# Patient Record
Sex: Female | Born: 1955 | Race: White | Hispanic: No | Marital: Single | Smoking: Current every day smoker
Health system: Southern US, Community
[De-identification: ages and names within clinical notes are randomized; demographics above are authoritative.]

## PROBLEM LIST (undated history)

## (undated) DIAGNOSIS — M5430 Sciatica, unspecified side: Secondary | ICD-10-CM

## (undated) DIAGNOSIS — J309 Allergic rhinitis, unspecified: Secondary | ICD-10-CM

## (undated) DIAGNOSIS — N200 Calculus of kidney: Secondary | ICD-10-CM

## (undated) HISTORY — DX: Allergic rhinitis, unspecified: J30.9

## (undated) HISTORY — DX: Calculus of kidney: N20.0

## (undated) HISTORY — PX: NO PAST SURGERIES: SHX2092

## (undated) HISTORY — PX: EYE SURGERY: SHX253

---

## 2001-03-13 ENCOUNTER — Encounter: Payer: Self-pay | Admitting: Emergency Medicine

## 2001-03-13 ENCOUNTER — Emergency Department (HOSPITAL_COMMUNITY): Admission: EM | Admit: 2001-03-13 | Discharge: 2001-03-13 | Payer: Self-pay | Admitting: Emergency Medicine

## 2001-04-14 ENCOUNTER — Other Ambulatory Visit: Admission: RE | Admit: 2001-04-14 | Discharge: 2001-04-14 | Payer: Self-pay | Admitting: *Deleted

## 2009-04-29 ENCOUNTER — Emergency Department: Payer: Self-pay | Admitting: Emergency Medicine

## 2012-02-23 ENCOUNTER — Emergency Department (HOSPITAL_COMMUNITY): Payer: Self-pay

## 2012-02-23 ENCOUNTER — Emergency Department (HOSPITAL_COMMUNITY)
Admission: EM | Admit: 2012-02-23 | Discharge: 2012-02-23 | Disposition: A | Discharge status: Home / Self Care | Payer: Self-pay | Attending: Emergency Medicine | Admitting: Emergency Medicine

## 2012-02-23 ENCOUNTER — Encounter (HOSPITAL_COMMUNITY): Payer: Self-pay | Admitting: Emergency Medicine

## 2012-02-23 DIAGNOSIS — R109 Unspecified abdominal pain: Secondary | ICD-10-CM | POA: Insufficient documentation

## 2012-02-23 DIAGNOSIS — F17200 Nicotine dependence, unspecified, uncomplicated: Secondary | ICD-10-CM | POA: Insufficient documentation

## 2012-02-23 DIAGNOSIS — R911 Solitary pulmonary nodule: Secondary | ICD-10-CM | POA: Insufficient documentation

## 2012-02-23 DIAGNOSIS — N390 Urinary tract infection, site not specified: Secondary | ICD-10-CM | POA: Insufficient documentation

## 2012-02-23 DIAGNOSIS — R319 Hematuria, unspecified: Secondary | ICD-10-CM | POA: Insufficient documentation

## 2012-02-23 DIAGNOSIS — R071 Chest pain on breathing: Secondary | ICD-10-CM | POA: Insufficient documentation

## 2012-02-23 DIAGNOSIS — M549 Dorsalgia, unspecified: Secondary | ICD-10-CM | POA: Insufficient documentation

## 2012-02-23 HISTORY — DX: Sciatica, unspecified side: M54.30

## 2012-02-23 LAB — URINE MICROSCOPIC-ADD ON

## 2012-02-23 LAB — URINALYSIS, ROUTINE W REFLEX MICROSCOPIC
Bilirubin Urine: NEGATIVE
Glucose, UA: NEGATIVE mg/dL
Ketones, ur: NEGATIVE mg/dL
Nitrite: POSITIVE — AB
Protein, ur: NEGATIVE mg/dL
Specific Gravity, Urine: 1.02 (ref 1.005–1.030)
Urobilinogen, UA: 0.2 mg/dL (ref 0.0–1.0)
pH: 7 (ref 5.0–8.0)

## 2012-02-23 MED ORDER — ONDANSETRON HCL 4 MG/2ML IJ SOLN
4.0000 mg | Freq: Once | INTRAMUSCULAR | Status: AC
  Administered 2012-02-23: 4 mg via INTRAVENOUS
  Filled 2012-02-23: qty 2

## 2012-02-23 MED ORDER — OXYCODONE-ACETAMINOPHEN 5-325 MG PO TABS: 1.0000 | ORAL_TABLET | ORAL | Status: AC | PRN

## 2012-02-23 MED ORDER — CIPROFLOXACIN HCL 250 MG PO TABS: 250.0000 mg | ORAL_TABLET | Freq: Two times a day (BID) | ORAL | Status: AC

## 2012-02-23 MED ORDER — MORPHINE SULFATE 4 MG/ML IJ SOLN
4.0000 mg | Freq: Once | INTRAMUSCULAR | Status: AC
  Administered 2012-02-23: 4 mg via INTRAVENOUS
  Filled 2012-02-23: qty 1

## 2012-02-23 NOTE — ED Notes (Signed)
Pt c/o mid abd radiating to left side for 2 weeks. Pt states it feels like she pulled a muscle so she had been applying heat.  Pt states today, she felt a "pop" in her left side and the pain became severe.

## 2012-02-23 NOTE — ED Notes (Signed)
Pt to CT via stretcher

## 2012-02-23 NOTE — ED Provider Notes (Signed)
History     CSN: [id number]  Arrival date & time 02/23/12  1347   First MD Initiated Contact with Patient 02/23/12 1359      Chief Complaint  Patient presents with  . Abdominal Pain    (Consider location/radiation/quality/duration/timing/severity/associated sxs/prior treatment) HPI Hx from patient. 56 year old female who presents with left side pain. She states this has been ongoing for about the past 2 weeks. She presents today, as she states she rolled over in bed and heard a "pop"; her pain suddenly worsened with this. Pain is sharp and somewhat pleuritic in nature, and worsens with movements. It is located primarily to the L lower ribs but radiates down her side. Waxes and wanes in intensity. She denies any shortness of breath, nausea, vomiting, diaphoresis. Denies urinary sx. She's never had anything like this before. No known injury. No treatment at home prior to arrival.  Past Medical History  Diagnosis Date  . Sciatica     History reviewed. No pertinent past surgical history.  History reviewed. No pertinent family history.  History  Substance Use Topics  . Smoking status: Current Everyday Smoker  . Smokeless tobacco: Not on file  . Alcohol Use: No    OB History    Grav Para Term Preterm Abortions TAB SAB Ect Mult Living                  Review of Systems  Constitutional: Negative for fever, chills, activity change and appetite change.  HENT: Negative.   Respiratory: Negative for chest tightness and shortness of breath.   Cardiovascular: Positive for chest pain. Negative for palpitations.  Gastrointestinal: Negative for nausea, vomiting and abdominal pain.  Genitourinary: Negative for decreased urine volume.  Musculoskeletal: Positive for myalgias.  Skin: Negative for color change and rash.  Neurological: Negative for dizziness, weakness and numbness.    Allergies  Sulfa antibiotics  Home Medications  No current outpatient prescriptions on file.  BP  167/99  Pulse 79  Temp(Src) 99 F (37.2 C) (Oral)  Resp 27  SpO2 100%  Physical Exam  Nursing note and vitals reviewed. Constitutional: She appears well-developed and well-nourished. No distress.  HENT:  Head: Normocephalic and atraumatic.  Eyes: Pupils are equal, round, and reactive to light.  Neck: Normal range of motion.  Cardiovascular: Normal rate, regular rhythm and normal heart sounds.   Pulmonary/Chest: Effort normal and breath sounds normal. She exhibits tenderness.       TTP to L lower ribs, no palpable crepitus or deformity Breath sounds normal throughout bilateral lung fields  Abdominal: Soft. Bowel sounds are normal. There is no tenderness. There is no rebound and no guarding.  Musculoskeletal: Normal range of motion.  Neurological: She is alert.  Skin: Skin is warm and dry. She is not diaphoretic.  Psychiatric: She has a normal mood and affect.    ED Course  Procedures (including critical care time)  Labs Reviewed  URINALYSIS, ROUTINE W REFLEX MICROSCOPIC - Abnormal; Notable for the following:    APPearance CLOUDY (*)    Hgb urine dipstick LARGE (*)    Nitrite POSITIVE (*)    Leukocytes, UA SMALL (*)    All other components within normal limits  URINE MICROSCOPIC-ADD ON - Abnormal; Notable for the following:    Bacteria, UA MANY (*)    All other components within normal limits   Ct Abdomen Pelvis Wo Contrast  02/23/2012  *RADIOLOGY REPORT*  Clinical Data: Left flank pain.  Hematuria.  CT ABDOMEN AND PELVIS WITHOUT  CONTRAST  Technique:  Multidetector CT imaging of the abdomen and pelvis was performed following the standard protocol without intravenous contrast.  Comparison: Rib radiographs same date.  No other comparison studies.  Findings: As suggested on the preceding radiographs, there is a lobulated noncalcified pulmonary nodule anteriorly at the left lung base, probably anterior to the fissure on the reformatted images. This measures 1.3 cm in diameter.  The  lung bases are otherwise clear.  There is a 3 mm calculus in the lower pole of the left kidney.  No other renal calculi are demonstrated.  There is mild left-sided hydronephrosis and perinephric soft tissue stranding.  However, there is no evidence of left ureteral or bladder calculus.  The right kidney appears normal.  The liver demonstrates diffuse steatosis with probable sparing centrally.  As evaluated in the noncontrast state, the gallbladder, biliary system, pancreas, spleen and adrenal glands appear normal.  The bowel gas pattern is normal.  Although the appendix is slightly prominent, it is filled with air and shows no surrounding inflammatory change.  The uterus and ovaries appear normal.  There are bilateral pelvic phleboliths.  There is moderate diffuse aorto iliac atherosclerosis.  There are bilateral hip degenerative changes.  IMPRESSION:  1.  Nonobstructing 3 mm left renal calculus. 2.  Left-sided hydronephrosis and perinephric soft tissue stranding of undetermined etiology.  There is no evidence of ureteral or bladder calculus.  Findings could be secondary to a recently passed ureteral calculus or blood clot.  Correlation with urinalysis recommended. 3.  Left basilar pulmonary nodule, likely in the lingula.  This is concerning for bronchogenic carcinoma.  Full chest CT again recommended.  Original Report Authenticated By: Gerrianne Scale, M.D.   Dg Ribs Unilateral W/chest Left  02/23/2012  *RADIOLOGY REPORT*  Clinical Data: Left posterior rib and back pain.  LEFT RIBS AND CHEST - 3+ VIEW  Comparison: None.  Findings: The heart size and mediastinal contours are normal.  No acute left-sided rib fractures are identified.  There are probable old fractures of the left anterior sixth and seventh ribs.  There is concern of a 1.4 cm nodule at the left lung base, overlapping the cardiac apex.  This does not appear to be a rib lesion based on the oblique views.  A nodular density projecting over the  anterior aspect of the left first rib is probably osseous but is only visualized in one projection.  Contralateral density is more clearly osseous.  There is no confluent airspace opacity, pleural effusion or pneumothorax.  IMPRESSION:  1.  No evidence of acute left-sided rib fracture.  There are probable old left-sided rib fractures. 2.  Possible left basilar pulmonary nodule.  This could reflect a skin lesion (not a nipple shadow) or a granuloma.  However, chest CT is recommended since there are no prior studies for comparison. The apical density (likely osseous) could be addressed at that time.  Original Report Authenticated By: Gerrianne Scale, M.D.     No diagnosis found. 1) hematuria 2) UTI 3) pulmonary nodule 4) L flank pain    MDM  This 56 year old female presents with left-sided pain. This has been present for about the past 2 weeks, but suddenly worsened today. She additionally began to notice pain with urination while in the dept. Vital signs are stable. She does have reproducible pain over the left lower ribs laterally and posteriorly. No notable CVA tenderness. She was medicated with one dose of morphine and Zofran, and did well with this. UA  does show evidence for possible UTI, with heme-positive urine. We did proceed with a CT of the abdomen and pelvis, which showed some left perinephric soft tissue stranding, which may indicate recently passed stone. The CT also showed evidence of a pulmonary nodule, which is concerning for bronchogenic carcinoma, especially in light of patient's smoking history. I discussed these findings with the patient, and strongly emphasized the importance of followup for an outpatient chest CT. She verbalized understanding of this. Will treat with abx for possible UTI. Return precautions discussed.  Case d/w Dr. Lynelle Doctor.       Grant Fontana, [place] 02/23/12 1754  Grant Fontana, [place] 02/23/12 1755

## 2012-02-23 NOTE — ED Notes (Signed)
Pt reports left side pain for 2 weeks, today felt something "Pop" and is having severe pain

## 2012-02-23 NOTE — Discharge Instructions (Signed)
As we discussed, your CT of your abdomen did show a possible pulmonary nodule. It is very important that you follow up with your primary care doctor or with oncology for a chest CT to get this evaluated further, especially as you are a smoker.   Your CT scan did show some changes that show you likely recently passed a stone, which may explain the blood in your urine. Your urine also showed that it may be infected so we will treat you for this.   Return to the ER with fever, chills, worsening pain, or any other worrisome symptoms.  RESOURCE GUIDE  Dental Problems  Patients with Medicaid: [facility] [phone]                                           [phone] [address] [employer] Phone:  [phone]                                                  Phone:  [phone]  If unable to pay or uninsured, contact:  [facility] or [facility]. to become qualified for the adult dental clinic.  Chronic Pain Problems Contact [facility]  [phone] Patients need to be referred by their primary care doctor.  Insufficient Money for Medicine Contact [employer]:  call "211" or [facility] (201)242-9996.  No Primary Care Doctor Call [facility]  [phone] Other agencies that provide inexpensive medical care    [facility]  [phone]    [facility]  [phone]    [facility]  5077297705    [facility]  [phone]    [facility]  [phone]    [facility]  [phone]  Psychological Services [facility] Behavioral Health  [phone] [facility] Services  [phone] [facility] Mental Health   [phone] (emergency services (785) [phone])  Substance Abuse Resources [facility] Drug Services  [phone] [facility] (780) [phone] [facility] ([phone] Floydene Flock [phone] Residential & Outpatient Substance Abuse Program   ([phone]  Abuse/Neglect [facility] Child Abuse Hotline 986-418-1101 [facility] Child Abuse Hotline 249-166-4180 (After Hours)  Emergency Shelter [facility] (660)315-7115  Maternity Homes [facility] at the [place] [facility] (618)846-2987 [facility] [phone]  MRSA Hotline #:   540-738-6777    [facility] Resources  [facility]     [employer]                          [facility] Dept. [address]                        [address]      [address]  [address]  Sela Hua Phone:  [phone]                                   Phone:  [phone]                 Phone:  [place] Phone:  [place] [phone] (After Hours)

## 2012-02-23 NOTE — ED Notes (Addendum)
Radiology tech came for pt and pt states she is not having pain anymore and doesn't want the xray.  Miranda Evans PA made aware and would like pt to be encouraged to have xray as she was tender to palpation of ribs.  This was explained to pt and pt now agreeing to have xray. Xray tech made aware.

## 2012-02-23 NOTE — ED Notes (Signed)
Pt c/o increased pain to lower abd and states it feels like the pain is in her urethra.

## 2012-02-23 NOTE — ED Provider Notes (Signed)
Medical screening examination/treatment/procedure(s) were performed by non-physician practitioner and as supervising physician I was immediately available for consultation/collaboration. Devoria Albe, MD, Armando Gang   Ward Givens, MD 02/23/12 [phone]

## 2012-04-17 ENCOUNTER — Encounter: Payer: Self-pay | Admitting: Pulmonary Disease

## 2012-04-17 DIAGNOSIS — M5430 Sciatica, unspecified side: Secondary | ICD-10-CM | POA: Insufficient documentation

## 2012-04-20 ENCOUNTER — Encounter: Payer: Self-pay | Admitting: Pulmonary Disease

## 2012-04-20 ENCOUNTER — Ambulatory Visit (INDEPENDENT_AMBULATORY_CARE_PROVIDER_SITE_OTHER): Payer: Self-pay | Admitting: Pulmonary Disease

## 2012-04-20 VITALS — BP 148/88 | HR 75 | Temp 98.1°F | Ht 60.5 in | Wt 157.4 lb

## 2012-04-20 DIAGNOSIS — J309 Allergic rhinitis, unspecified: Secondary | ICD-10-CM | POA: Insufficient documentation

## 2012-04-20 DIAGNOSIS — R911 Solitary pulmonary nodule: Secondary | ICD-10-CM | POA: Insufficient documentation

## 2012-04-20 DIAGNOSIS — N200 Calculus of kidney: Secondary | ICD-10-CM | POA: Insufficient documentation

## 2012-04-20 NOTE — Patient Instructions (Signed)
Will schedule for PET scan, and will discuss with you once results are available. Stop smoking.

## 2012-04-20 NOTE — Assessment & Plan Note (Signed)
The patient has a 1.6 cm nodule in her left lower lobe of unknown significance.  She does have a long history of smoking off and on, and I am concerned about the presenting size of the nodule in question.  Unfortunately, she has no old CTs for comparison.  I have outlined different approaches with her, including surveillance, biopsy, surgical resection, and PET scanning.  I think it would be worthwhile to do a PET scan, and is it is abnormal, will need either surgical resection or biopsy.  If the PET is unremarkable, I would be satisfied with that following this over time with serial CT scans.  I have also encouraged her to work aggressively on smoking cessation.

## 2012-04-20 NOTE — Progress Notes (Signed)
  Subjective:    Patient ID: Miranda Gould, female    DOB: 04/04/1957, 56 y.o.   MRN: [id number]  HPI The patient is a 56 year old female who I've been asked to see for an abnormal CT chest.  She recently had discomfort consistent with kidney stones, and a CT of her abdomen prepped up an abnormality in the left lower lobe.  She subsequently underwent a full CT chest which showed a 1.2 x 1.6 nodule in the left base, but no mediastinal lymphadenopathy.  The patient is a current smoker, and has done so for years.  She has never had a prior CT chest, and has never been told that she had an abnormal chest x-ray.  She has no personal history of cancer.  She has never lived in [place] or [place], and has no history of TB exposure.  She had a negative skin test 4 years ago.  She does have a smokers cough with intermittent mucus, but denies any hemoptysis.  She has not been losing weight, and has an adequate appetite.   Review of Systems  Constitutional: Negative for fever and unexpected weight change.  HENT: Negative for ear pain, nosebleeds, congestion, sore throat, rhinorrhea, sneezing, trouble swallowing, dental problem, postnasal drip and sinus pressure.   Eyes: Negative for redness and itching.  Respiratory: Positive for cough and shortness of breath. Negative for chest tightness and wheezing.   Cardiovascular: Negative for palpitations and leg swelling.  Gastrointestinal: Negative for nausea and vomiting.       Heartburn  Genitourinary: Negative for dysuria.  Musculoskeletal: Positive for arthralgias. Negative for joint swelling.  Skin: Negative.  Negative for rash.  Neurological: Negative for headaches.  Hematological: Does not bruise/bleed easily.  Psychiatric/Behavioral: Negative for dysphoric mood. The patient is nervous/anxious.   All other systems reviewed and are negative.       Objective:   Physical Exam Constitutional:  Well developed, no acute distress  HENT:  Nares patent  without discharge, but narrowed bilat  Oropharynx without exudate, palate and uvula are normal  Eyes:  Perrla, eomi, no scleral icterus  Neck:  No JVD, no TMG  Cardiovascular:  Normal rate, regular rhythm, no rubs or gallops.  No murmurs        Intact distal pulses  Pulmonary :  Normal breath sounds, no stridor or respiratory distress   No rales, rhonchi, or wheezing  Abdominal:  Soft, nondistended, bowel sounds present.  No tenderness noted.   Musculoskeletal:  No lower extremity edema noted.  Lymph Nodes:  No cervical lymphadenopathy noted  Skin:  No cyanosis noted  Neurologic:  Alert, appropriate, moves all 4 extremities without obvious deficit.         Assessment & Plan:

## 2012-04-23 ENCOUNTER — Ambulatory Visit (HOSPITAL_COMMUNITY)
Admission: RE | Admit: 2012-04-23 | Discharge: 2012-04-23 | Disposition: A | Discharge status: Home / Self Care | Payer: Self-pay | Source: Ambulatory Visit | Attending: Pulmonary Disease | Admitting: Pulmonary Disease

## 2012-04-23 ENCOUNTER — Encounter (HOSPITAL_COMMUNITY): Payer: Self-pay

## 2012-04-23 DIAGNOSIS — K7689 Other specified diseases of liver: Secondary | ICD-10-CM | POA: Insufficient documentation

## 2012-04-23 DIAGNOSIS — N200 Calculus of kidney: Secondary | ICD-10-CM | POA: Insufficient documentation

## 2012-04-23 DIAGNOSIS — R911 Solitary pulmonary nodule: Secondary | ICD-10-CM | POA: Insufficient documentation

## 2012-04-23 MED ORDER — FLUDEOXYGLUCOSE F - 18 (FDG) INJECTION
14.3000 | Freq: Once | INTRAVENOUS | Status: AC | PRN
  Administered 2012-04-23: 14.3 via INTRAVENOUS

## 2012-05-01 ENCOUNTER — Other Ambulatory Visit: Payer: Self-pay | Admitting: Pulmonary Disease

## 2012-05-01 DIAGNOSIS — R911 Solitary pulmonary nodule: Secondary | ICD-10-CM

## 2012-08-20 ENCOUNTER — Other Ambulatory Visit: Payer: Self-pay

## 2012-12-01 ENCOUNTER — Encounter (HOSPITAL_COMMUNITY): Payer: Self-pay

## 2012-12-01 ENCOUNTER — Emergency Department (HOSPITAL_COMMUNITY)
Admission: EM | Admit: 2012-12-01 | Discharge: 2012-12-01 | Disposition: A | Discharge status: Home / Self Care | Payer: Self-pay | Attending: Emergency Medicine | Admitting: Emergency Medicine

## 2012-12-01 ENCOUNTER — Emergency Department (HOSPITAL_COMMUNITY): Payer: Self-pay

## 2012-12-01 DIAGNOSIS — N200 Calculus of kidney: Secondary | ICD-10-CM | POA: Insufficient documentation

## 2012-12-01 DIAGNOSIS — R112 Nausea with vomiting, unspecified: Secondary | ICD-10-CM | POA: Insufficient documentation

## 2012-12-01 DIAGNOSIS — R319 Hematuria, unspecified: Secondary | ICD-10-CM | POA: Insufficient documentation

## 2012-12-01 DIAGNOSIS — F17200 Nicotine dependence, unspecified, uncomplicated: Secondary | ICD-10-CM | POA: Insufficient documentation

## 2012-12-01 DIAGNOSIS — Z8739 Personal history of other diseases of the musculoskeletal system and connective tissue: Secondary | ICD-10-CM | POA: Insufficient documentation

## 2012-12-01 DIAGNOSIS — N139 Obstructive and reflux uropathy, unspecified: Secondary | ICD-10-CM | POA: Insufficient documentation

## 2012-12-01 DIAGNOSIS — Z8709 Personal history of other diseases of the respiratory system: Secondary | ICD-10-CM | POA: Insufficient documentation

## 2012-12-01 HISTORY — DX: Calculus of kidney: N20.0

## 2012-12-01 LAB — URINALYSIS, ROUTINE W REFLEX MICROSCOPIC
Glucose, UA: NEGATIVE mg/dL
Specific Gravity, Urine: 1.022 (ref 1.005–1.030)
pH: 6 (ref 5.0–8.0)

## 2012-12-01 LAB — CBC WITH DIFFERENTIAL/PLATELET
HCT: 50 % — ABNORMAL HIGH (ref 36.0–46.0)
Hemoglobin: 17.7 g/dL — ABNORMAL HIGH (ref 12.0–15.0)
MCH: 30.1 pg (ref 26.0–34.0)
MCV: 84.9 fL (ref 78.0–100.0)
RBC: 5.89 MIL/uL — ABNORMAL HIGH (ref 3.87–5.11)

## 2012-12-01 LAB — URINE MICROSCOPIC-ADD ON

## 2012-12-01 MED ORDER — SODIUM CHLORIDE 0.9 % IV SOLN
Freq: Once | INTRAVENOUS | Status: AC
  Administered 2012-12-01: 19:00:00 via INTRAVENOUS

## 2012-12-01 MED ORDER — ONDANSETRON HCL 4 MG/2ML IJ SOLN
4.0000 mg | Freq: Once | INTRAMUSCULAR | Status: AC
  Administered 2012-12-01: 4 mg via INTRAVENOUS
  Filled 2012-12-01: qty 2

## 2012-12-01 MED ORDER — HYDROMORPHONE HCL PF 1 MG/ML IJ SOLN
1.0000 mg | Freq: Once | INTRAMUSCULAR | Status: AC
  Administered 2012-12-01: 1 mg via INTRAVENOUS
  Filled 2012-12-01: qty 1

## 2012-12-01 MED ORDER — KETOROLAC TROMETHAMINE 30 MG/ML IJ SOLN
30.0000 mg | Freq: Once | INTRAMUSCULAR | Status: AC
  Administered 2012-12-01: 30 mg via INTRAVENOUS
  Filled 2012-12-01: qty 1

## 2012-12-01 MED ORDER — TAMSULOSIN HCL 0.4 MG PO CAPS: 0.4000 mg | ORAL_CAPSULE | Freq: Every day | ORAL | Status: DC

## 2012-12-01 MED ORDER — DEXTROSE 5 % IV SOLN
1.0000 g | Freq: Once | INTRAVENOUS | Status: AC
  Administered 2012-12-01: 1 g via INTRAVENOUS
  Filled 2012-12-01: qty 10

## 2012-12-01 MED ORDER — POLYETHYLENE GLYCOL 3350 17 GM/SCOOP PO POWD: 17.0000 g | Freq: Every day | ORAL | Status: AC

## 2012-12-01 MED ORDER — OXYCODONE-ACETAMINOPHEN 5-325 MG PO TABS: 1.0000 | ORAL_TABLET | ORAL | Status: DC | PRN

## 2012-12-01 NOTE — ED Notes (Signed)
Patient c/o left flank pain that began last night and has gotten progressively worse. Patient having dysuria and hesitancy. Patient  States she has vomited x 5 today and having constant nausea. Patient has a history of kidney stones.

## 2012-12-01 NOTE — ED Provider Notes (Signed)
[facility] 8:00PM patient discussed in sign out with Sharen Hones NP. Patient with left kidney stone some signs concerning for infection. Plan to consult urology. Gram Rocephin ordered.   I-STAT results show sodium 140, potassium 4.1, chloride 111, CO2 20, BUN 14, creatinine 1.1, glucose 115.  Hemoglobin 17.7, hematocrit 52%   Spoke with Dr. Patsi Sears with urology. Discussed the urinalysis results and BMP. He does not feel patient needs admission for this. He recommends prescriptions for 0.4 mg Flomax, a strainer to look first pass stone and pain medication. He will follow patient in the office tomorrow. He does not recommend outpatient antibiotics.  Repeat vital signs appear normal. No fever. Patient will be discharged per Dr. Imelda Pillow instructions at this time.  Angus Seller, [place] 12/02/12 [phone]

## 2012-12-01 NOTE — ED Provider Notes (Signed)
History     CSN: [id number]  Arrival date & time 12/01/12  1702   First MD Initiated Contact with Patient 12/01/12 1846      Chief Complaint  Patient presents with  . Flank Pain    (Consider location/radiation/quality/duration/timing/severity/associated sxs/prior treatment) HPI Comments: Patient with L flank pain that started last night and has been waxing and waning in intensity since  This is very like her previous kidney stones   The history is provided by the patient.    Past Medical History  Diagnosis Date  . Sciatica   . Kidney stones   . Allergic rhinitis   . Kidney stone     Past Surgical History  Procedure Date  . No past surgeries   . Eye surgery     Family History  Problem Relation Age of Onset  . Arthritis Brother     5 brothers  . Allergies      niece  . Heart attack Mother   . Breast cancer Mother     History  Substance Use Topics  . Smoking status: Current Every Day Smoker -- 0.5 packs/day for 15 years    Types: Cigarettes  . Smokeless tobacco: Never Used  . Alcohol Use: No    OB History    Grav Para Term Preterm Abortions TAB SAB Ect Mult Living                  Review of Systems  Constitutional: Negative for fever and chills.  HENT: Negative.   Eyes: Negative.   Gastrointestinal: Positive for nausea and vomiting.  Genitourinary: Positive for hematuria and flank pain. Negative for dysuria and urgency.  Musculoskeletal: Negative.   Skin: Negative for rash and wound.  Neurological: Negative for dizziness.    Allergies  Sulfa antibiotics  Home Medications   Current Outpatient Rx  Name  Route  Sig  Dispense  Refill  . CRANBERRY 450 MG PO TABS   Oral   Take 2 tablets by mouth once.           BP 162/90  Pulse 93  Temp 98.5 F (36.9 C) (Oral)  Resp 20  SpO2 97%  Physical Exam  Constitutional: She is oriented to person, place, and time. She appears well-developed and well-nourished.  HENT:  Head: Normocephalic.   Eyes: Pupils are equal, round, and reactive to light.  Neck: Normal range of motion.  Cardiovascular: Normal rate and regular rhythm.   Pulmonary/Chest: Effort normal. No respiratory distress. She has no wheezes.  Abdominal: Soft. Bowel sounds are normal. She exhibits no distension. There is no tenderness.  Musculoskeletal: Normal range of motion.  Neurological: She is alert and oriented to person, place, and time.  Skin: There is pallor.    ED Course  Procedures (including critical care time)  Labs Reviewed  URINALYSIS, ROUTINE W REFLEX MICROSCOPIC - Abnormal; Notable for the following:    Color, Urine RED (*)  BIOCHEMICALS MAY BE AFFECTED BY COLOR   APPearance CLOUDY (*)     Hgb urine dipstick LARGE (*)     Bilirubin Urine SMALL (*)     Ketones, ur TRACE (*)     Protein, ur 100 (*)     Nitrite POSITIVE (*)     Leukocytes, UA MODERATE (*)     All other components within normal limits  URINE MICROSCOPIC-ADD ON - Abnormal; Notable for the following:    Bacteria, UA MANY (*)     All other components within normal limits  CBC WITH DIFFERENTIAL  URINE CULTURE   Ct Abdomen Pelvis Wo Contrast  12/01/2012  *RADIOLOGY REPORT*  Clinical Data: Left flank pain began last night.  Symptoms have gotten progressively worse.  Dysuria, hesitancy.  Vomiting five times today.  Nausea.  History of kidney stones.  CT ABDOMEN AND PELVIS WITHOUT CONTRAST  Technique:  Multidetector CT imaging of the abdomen and pelvis was performed following the standard protocol without intravenous contrast.  Comparison: 06/27/2013and 02/23/2012  Findings: At the left lung base, there is a lobulated nodule measuring 1.1 cm, stable in appearance compared to previous study.  There is left-sided hydronephrosis and hydroureter secondary to a distal ureteral calculus measuring 4 x 5 mm. Intrarenal calculi are noted bilaterally, measuring one - 2 mm in diameter.  On the right, no ureteral stones are seen.  No focal abnormality  identified within the liver, spleen, pancreas, or adrenal glands.  Gallbladder is present.  The stomach and small bowel loops have a normal appearance. The appendix is well seen and has a normal appearance.  Colonic loops show numerous diverticula but no associated inflammation.  No evidence for abscess.  The uterus is present.  No adnexal mass or free pelvic fluid. There is atherosclerotic calcification of the abdominal aorta. Degenerative changes are seen in the spine.  IMPRESSION:  1.  Obstructing left ureteral calculus measuring 4 x 5 mm approximately 2 cm proximal to the ureteral vesicle junction. 2.  Diverticulosis without evidence for acute diverticulitis. 3.  Stable left lower lobe nodule, shown to lack of FDG avidity on previous PET CT. Findings favor a benign process.  Follow-up chest CT is suggested in 6 months.   Original Report Authenticated By: Norva Pavlov, M.D.      1. Kidney stones   2. Urinary (tract) obstruction       MDM   Concern for repeat kidney stone will treat pain and obtain CT Scan, UA and renal funtion study  Patient has 4mm stone with obstruction       Arman Filter, NP 12/01/12 1931  Arman Filter, NP 12/02/12 1022

## 2012-12-02 LAB — POCT I-STAT, CHEM 8
BUN: 14 mg/dL (ref 6–23)
Calcium, Ion: 1.17 mmol/L (ref 1.12–1.23)
Chloride: 111 mEq/L (ref 96–112)
Creatinine, Ser: 1.1 mg/dL (ref 0.50–1.10)
Sodium: 140 mEq/L (ref 135–145)
TCO2: 20 mmol/L (ref 0–100)

## 2012-12-02 LAB — URINE CULTURE

## 2012-12-04 NOTE — ED Provider Notes (Signed)
Medical screening examination/treatment/procedure(s) were conducted as a shared visit with non-physician practitioner(s) and myself.  I personally evaluated the patient during the encounter   Abundio Teuscher, MD 12/04/12 1753 

## 2012-12-04 NOTE — ED Provider Notes (Signed)
Medical screening examination/treatment/procedure(s) were performed by non-physician practitioner and as supervising physician I was immediately available for consultation/collaboration.   Loren Racer, MD 12/04/12 1750

## 2013-12-01 ENCOUNTER — Emergency Department (HOSPITAL_BASED_OUTPATIENT_CLINIC_OR_DEPARTMENT_OTHER): Payer: Self-pay

## 2013-12-01 ENCOUNTER — Encounter (HOSPITAL_BASED_OUTPATIENT_CLINIC_OR_DEPARTMENT_OTHER): Payer: Self-pay | Admitting: Emergency Medicine

## 2013-12-01 ENCOUNTER — Emergency Department (HOSPITAL_BASED_OUTPATIENT_CLINIC_OR_DEPARTMENT_OTHER)
Admission: EM | Admit: 2013-12-01 | Discharge: 2013-12-01 | Disposition: A | Discharge status: Home / Self Care | Payer: Self-pay | Attending: Emergency Medicine | Admitting: Emergency Medicine

## 2013-12-01 DIAGNOSIS — Z79899 Other long term (current) drug therapy: Secondary | ICD-10-CM | POA: Insufficient documentation

## 2013-12-01 DIAGNOSIS — F17200 Nicotine dependence, unspecified, uncomplicated: Secondary | ICD-10-CM | POA: Insufficient documentation

## 2013-12-01 DIAGNOSIS — Z8739 Personal history of other diseases of the musculoskeletal system and connective tissue: Secondary | ICD-10-CM | POA: Insufficient documentation

## 2013-12-01 DIAGNOSIS — N200 Calculus of kidney: Secondary | ICD-10-CM | POA: Insufficient documentation

## 2013-12-01 LAB — URINALYSIS, ROUTINE W REFLEX MICROSCOPIC
BILIRUBIN URINE: NEGATIVE
Glucose, UA: NEGATIVE mg/dL
KETONES UR: 15 mg/dL — AB
NITRITE: NEGATIVE
Protein, ur: 30 mg/dL — AB
SPECIFIC GRAVITY, URINE: 1.021 (ref 1.005–1.030)
UROBILINOGEN UA: 1 mg/dL (ref 0.0–1.0)
pH: 6 (ref 5.0–8.0)

## 2013-12-01 LAB — URINE MICROSCOPIC-ADD ON

## 2013-12-01 MED ORDER — IBUPROFEN 800 MG PO TABS: 800.0000 mg | ORAL_TABLET | Freq: Three times a day (TID) | ORAL | Status: AC | PRN

## 2013-12-01 MED ORDER — HYDROMORPHONE HCL PF 1 MG/ML IJ SOLN
1.0000 mg | Freq: Once | INTRAMUSCULAR | Status: AC
  Administered 2013-12-01: 1 mg via INTRAVENOUS
  Filled 2013-12-01: qty 1

## 2013-12-01 MED ORDER — OXYCODONE-ACETAMINOPHEN 5-325 MG PO TABS: 1.0000 | ORAL_TABLET | ORAL | Status: DC | PRN

## 2013-12-01 MED ORDER — TAMSULOSIN HCL 0.4 MG PO CAPS: 0.4000 mg | ORAL_CAPSULE | Freq: Every day | ORAL | Status: DC

## 2013-12-01 MED ORDER — ONDANSETRON HCL 4 MG/2ML IJ SOLN
4.0000 mg | Freq: Once | INTRAMUSCULAR | Status: AC
  Administered 2013-12-01: 4 mg via INTRAVENOUS
  Filled 2013-12-01: qty 2

## 2013-12-01 NOTE — ED Notes (Signed)
Pt c/o left flank pain x 6 hrs HX kidney stones

## 2013-12-01 NOTE — ED Notes (Signed)
Patient transported to CT 

## 2013-12-01 NOTE — ED Provider Notes (Signed)
CSN: [id number]     Arrival date & time 12/01/13  1820 History  This chart was scribed for Miranda B. Bernette Mayers, MD by Danella Maiers, ED Scribe. This patient was seen in room MH06/MH06 and the patient's care was started at 7:17 PM.   Chief Complaint  Patient presents with  . Flank Pain   The history is provided by the patient. No language interpreter was used.   HPI Comments: Miranda Gould is a 58 y.o. female with a h/o kidney stones who presents to the Emergency Department complaining of sudden-onset, severe left flank pain onset noon today with decreased urine output. She reports intermittent pain over the last few weeks. She has had 2 stones in the past year, last time was 6 months ago. She has cut down on her soda intake. She denies dysuria.    Past Medical History  Diagnosis Date  . Sciatica   . Kidney stones   . Allergic rhinitis   . Kidney stone    Past Surgical History  Procedure Laterality Date  . No past surgeries    . Eye surgery     Family History  Problem Relation Age of Onset  . Arthritis Brother     5 brothers  . Allergies      niece  . Heart attack Mother   . Breast cancer Mother    History  Substance Use Topics  . Smoking status: Current Every Day Smoker -- 0.50 packs/day for 15 years    Types: Cigarettes  . Smokeless tobacco: Never Used  . Alcohol Use: No   OB History   Grav Para Term Preterm Abortions TAB SAB Ect Mult Living                 Review of Systems  Genitourinary: Positive for flank pain. Negative for dysuria and decreased urine volume.   A complete 10 system review of systems was obtained and all systems are negative except as noted in the HPI and PMH.    Allergies  Sulfa antibiotics  Home Medications   Current Outpatient Rx  Name  Route  Sig  Dispense  Refill  . Cranberry (AZO-CRANBERRY) 450 MG TABS   Oral   Take 2 tablets by mouth once.         Marland Kitchen oxyCODONE-acetaminophen (PERCOCET) 5-325 MG per tablet   Oral   Take 1-2 tablets  by mouth every 4 (four) hours as needed for pain.   20 tablet   0   . polyethylene glycol powder (GLYCOLAX/MIRALAX) powder   Oral   Take 17 g by mouth daily.   255 g   0   . Tamsulosin HCl (FLOMAX) 0.4 MG CAPS   Oral   Take 1 capsule (0.4 mg total) by mouth daily.   30 capsule   0    BP 158/96  Pulse 77  Temp(Src) 98 F (36.7 C) (Oral)  Resp 18  Ht 5' 1.5" (1.562 m)  Wt 135 lb (61.236 kg)  BMI 25.10 kg/m2  SpO2 98% Physical Exam  Nursing note and vitals reviewed. Constitutional: She is oriented to person, place, and time. She appears well-developed and well-nourished.  HENT:  Head: Normocephalic and atraumatic.  Eyes: EOM are normal. Pupils are equal, round, and reactive to light.  Neck: Normal range of motion. Neck supple.  Cardiovascular: Normal rate, normal heart sounds and intact distal pulses.   Pulmonary/Chest: Effort normal and breath sounds normal.  Abdominal: Bowel sounds are normal. She exhibits no distension. There  is no tenderness.  Musculoskeletal: Normal range of motion. She exhibits no edema and no tenderness.  Neurological: She is alert and oriented to person, place, and time. She has normal strength. No cranial nerve deficit or sensory deficit.  Skin: Skin is warm and dry. No rash noted.  Psychiatric: She has a normal mood and affect.    ED Course  Procedures (including critical care time) Medications  HYDROmorphone (DILAUDID) injection 1 mg (not administered)  ondansetron (ZOFRAN) injection 4 mg (not administered)    DIAGNOSTIC STUDIES: Oxygen Saturation is 98% on RA, normal by my interpretation.    COORDINATION OF CARE: 7:24 PM- Discussed treatment plan with pt which includes CT abdomen and pain medication. Pt agrees to plan.    Labs Review Labs Reviewed  URINALYSIS, ROUTINE W REFLEX MICROSCOPIC - Abnormal; Notable for the following:    Color, Urine AMBER (*)    APPearance CLOUDY (*)    Hgb urine dipstick LARGE (*)    Ketones, ur 15  (*)    Protein, ur 30 (*)    Leukocytes, UA MODERATE (*)    All other components within normal limits  URINE MICROSCOPIC-ADD ON - Abnormal; Notable for the following:    Squamous Epithelial / LPF FEW (*)    Crystals CA OXALATE CRYSTALS (*)    All other components within normal limits   Imaging Review Ct Abdomen Pelvis Wo Contrast  12/01/2013   CLINICAL DATA:  Left flank pain.  History of stones.  EXAM: CT ABDOMEN AND PELVIS WITHOUT CONTRAST  TECHNIQUE: Multidetector CT imaging of the abdomen and pelvis was performed following the standard protocol without intravenous contrast.  COMPARISON:  CT ABD/PELV WO CM dated 12/01/2012; NM PET IMAGE INITIAL (PI) SKULL BASE TO THIGH dated 04/23/2012; CT ABD/PELV WO CM dated 02/23/2012  FINDINGS: Lingular nodule was not previously hypermetabolic and has faint calcification, probably a pulmonary hamartoma or similar benign lesion. This is also stable in size from 02/23/2012.  The noncontrast CT appearance of the liver, spleen, pancreas, and adrenal glands is within normal limits. Extensive left perirenal stranding noted with left hydronephrosis due to a 4 mm proximal ureteral calculus near the UPJ. Distal to this the left ureter has normal caliber, and no distal calculus is observed.  The urinary bladder is empty. There is a 3 mm right kidney lower pole nonobstructive calculus and a 2 mm left kidney lower pole nonobstructive calculus. There also several punctate 1-2 mm left kidney lower pole nonobstructive calculi.  No pathologic upper abdominal adenopathy is observed. No pathologic pelvic adenopathy is observed. Appendix normal. Uterine and adnexal contours unremarkable. Scattered sigmoid colon diverticula. No free pelvic fluid.  Considerable degenerative arthropathy of both hips with extensive spurring and severe loss of articular space.  IMPRESSION: 1. Obstructive 4 mm left proximal ureteral calculus with associated left hydronephrosis and extensive left perirenal  stranding. 2. There also bilateral nonobstructive renal calculi. 3. Severe osteoarthritis of both hips. 4. Colonic diverticulosis without active diverticulitis. 5. Lingular nodule, stable from 2013, negative on PET-CT, an with faint calcifications suggesting old granulomatous disease or pulmonary hamartoma.   Electronically Signed   By: Herbie Baltimore M.D.   On: 12/01/2013 20:00    EKG Interpretation   None       MDM   1. Kidney stones     Pain improved, CT shows ureteral stone as well as renal stones. Advised Uro followup.   I personally performed the services described in this documentation, which was scribed in my  presence. The recorded information has been reviewed and is accurate.      Miranda B. Bernette MayersSheldon, MD 12/01/13 [phone]

## 2013-12-01 NOTE — Discharge Instructions (Signed)
Kidney Stones  Kidney stones (urolithiasis) are deposits that form inside your kidneys. The intense pain is caused by the stone moving through the urinary tract. When the stone moves, the ureter goes into spasm around the stone. The stone is usually passed in the urine.   CAUSES   · A disorder that makes certain neck glands produce too much parathyroid hormone (primary hyperparathyroidism).  · A buildup of uric acid crystals, similar to gout in your joints.  · Narrowing (stricture) of the ureter.  · A kidney obstruction present at birth (congenital obstruction).  · Previous surgery on the kidney or ureters.  · Numerous kidney infections.  SYMPTOMS   · Feeling sick to your stomach (nauseous).  · Throwing up (vomiting).  · Blood in the urine (hematuria).  · Pain that usually spreads (radiates) to the groin.  · Frequency or urgency of urination.  DIAGNOSIS   · Taking a history and physical exam.  · Blood or urine tests.  · CT scan.  · Occasionally, an examination of the inside of the urinary bladder (cystoscopy) is performed.  TREATMENT   · Observation.  · Increasing your fluid intake.  · Extracorporeal shock wave lithotripsy This is a noninvasive procedure that uses shock waves to break up kidney stones.  · Surgery may be needed if you have severe pain or persistent obstruction. There are various surgical procedures. Most of the procedures are performed with the use of small instruments. Only small incisions are needed to accommodate these instruments, so recovery time is minimized.  The size, location, and chemical composition are all important variables that will determine the proper choice of action for you. Talk to your health care provider to better understand your situation so that you will minimize the risk of injury to yourself and your kidney.   HOME CARE INSTRUCTIONS   · Drink enough water and fluids to keep your urine clear or pale yellow. This will help you to pass the stone or stone fragments.  · Strain  all urine through the provided strainer. Keep all particulate matter and stones for your health care provider to see. The stone causing the pain may be as small as a grain of salt. It is very important to use the strainer each and every time you pass your urine. The collection of your stone will allow your health care provider to analyze it and verify that a stone has actually passed. The stone analysis will often identify what you can do to reduce the incidence of recurrences.  · Only take over-the-counter or prescription medicines for pain, discomfort, or fever as directed by your health care provider.  · Make a follow-up appointment with your health care provider as directed.  · Get follow-up X-rays if required. The absence of pain does not always mean that the stone has passed. It may have only stopped moving. If the urine remains completely obstructed, it can cause loss of kidney function or even complete destruction of the kidney. It is your responsibility to make sure X-rays and follow-ups are completed. Ultrasounds of the kidney can show blockages and the status of the kidney. Ultrasounds are not associated with any radiation and can be performed easily in a matter of minutes.  SEEK MEDICAL CARE IF:  · You experience pain that is progressive and unresponsive to any pain medicine you have been prescribed.  SEEK IMMEDIATE MEDICAL CARE IF:   · Pain cannot be controlled with the prescribed medicine.  · You have a fever   or shaking chills.  · The severity or intensity of pain increases over 18 hours and is not relieved by pain medicine.  · You develop a new onset of abdominal pain.  · You feel faint or pass out.  · You are unable to urinate.  MAKE SURE YOU:   · Understand these instructions.  · Will watch your condition.  · Will get help right away if you are not doing well or get worse.  Document Released: 10/14/2005 Document Revised: 06/16/2013 Document Reviewed: 03/17/2013  [employer]® Patient Information ©2014  [employer].

## 2013-12-01 NOTE — ED Notes (Signed)
MD at bedside. 

## 2015-10-24 IMAGING — CT CT ABD-PELV W/O CM
2 of 4 series · 15 of 46 positions shown, 17 images · non-contrast
Comparison: CT ABD/PELV WO CM dated 12/01/2012;

CLINICAL DATA: Left flank pain.  History of stones.

EXAM:
CT ABDOMEN AND PELVIS WITHOUT CONTRAST
TECHNIQUE: Multidetector CT imaging of the abdomen and pelvis was performed
following the standard protocol without intravenous contrast.

[Series 2: renal stone < 200 lbs 5.0 b31f · axial · 0.78mm/px · z∈[-506,-121]mm · 12 of 85 slices shown, 14 images]
[im 4/85  soft-tissue]
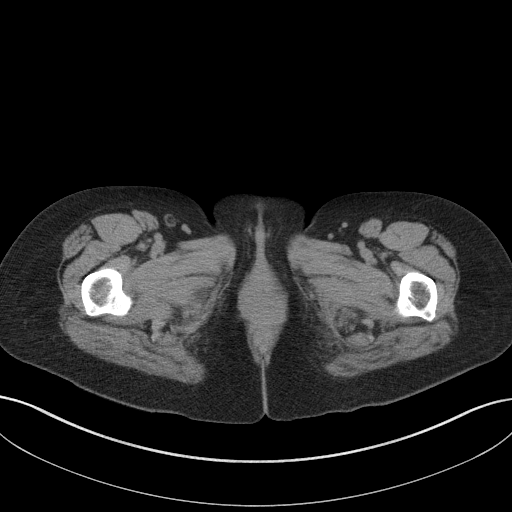
[im 4/85  bone]
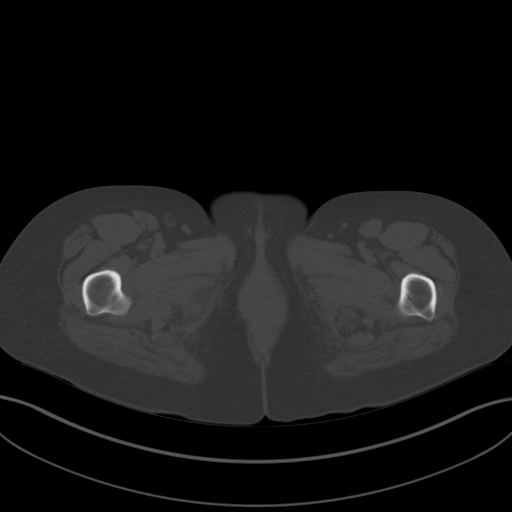
[im 11/85  soft-tissue]
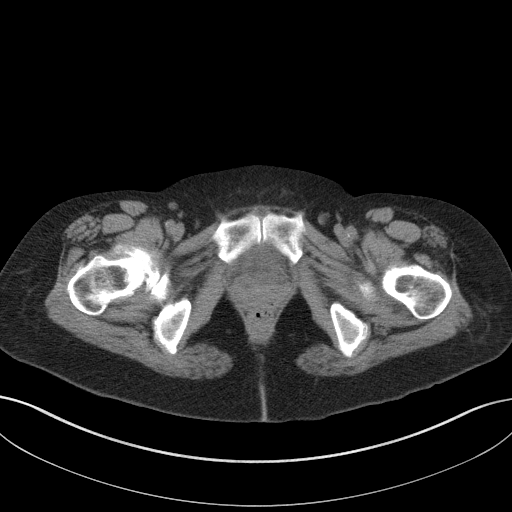
[im 18/85  soft-tissue]
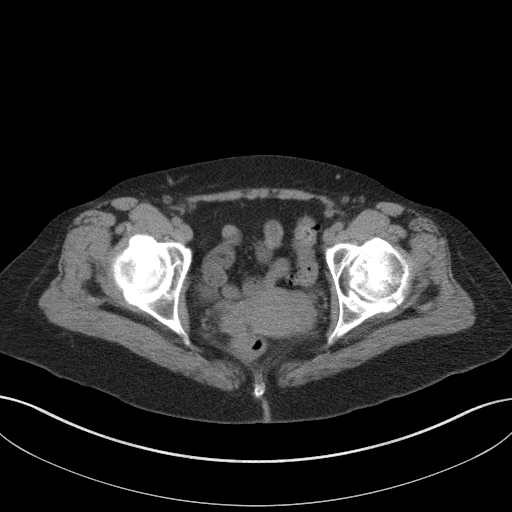
[im 25/85  soft-tissue]
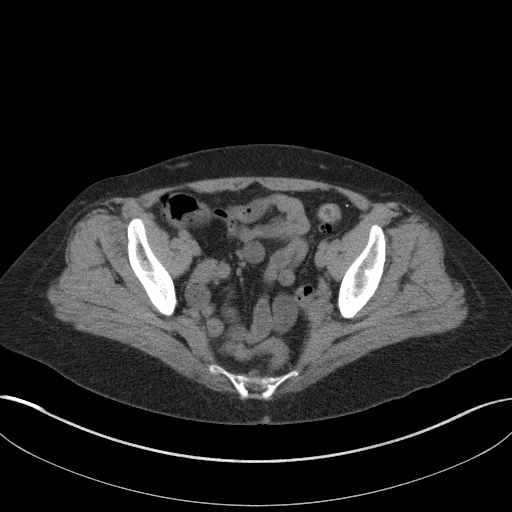
[im 32/85  soft-tissue]
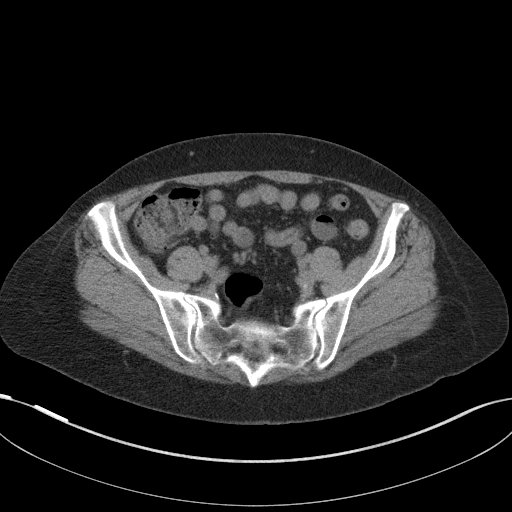
[im 39/85  soft-tissue]
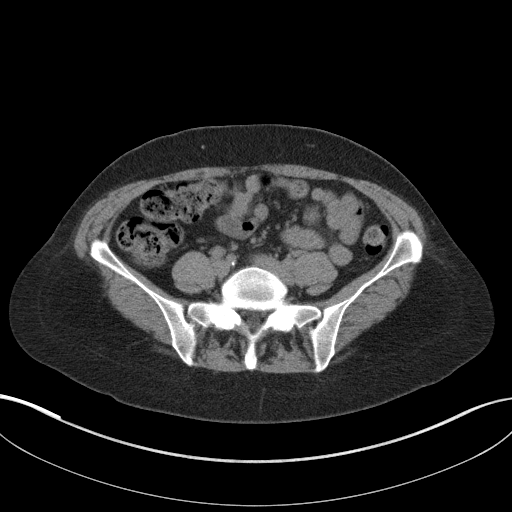
[im 46/85  soft-tissue]
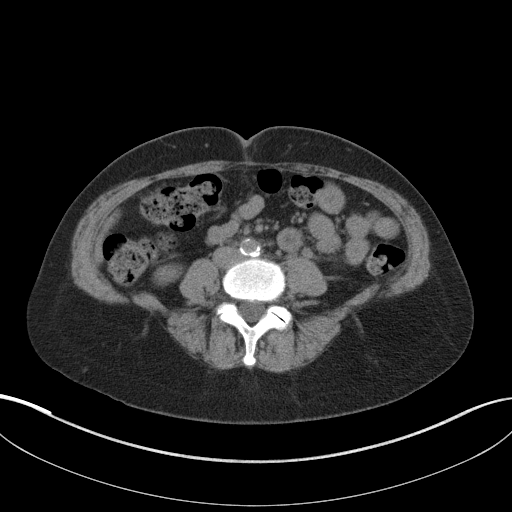
[im 53/85  soft-tissue]
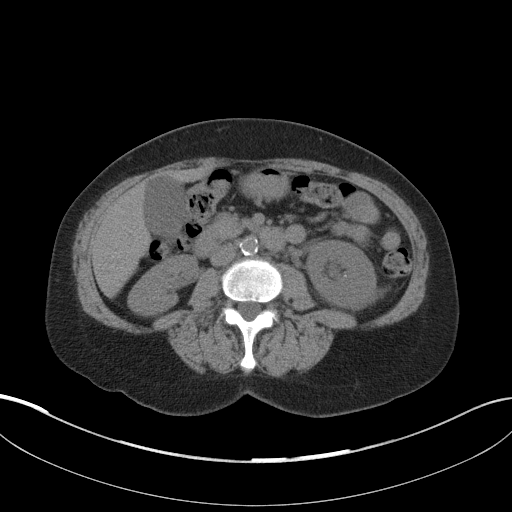
[im 60/85  soft-tissue]
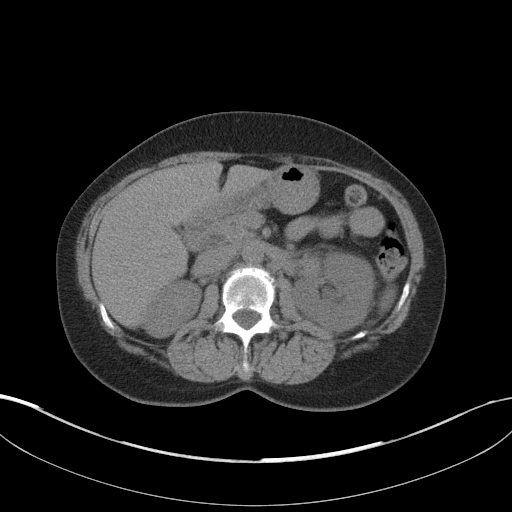
[im 60/85  bone]
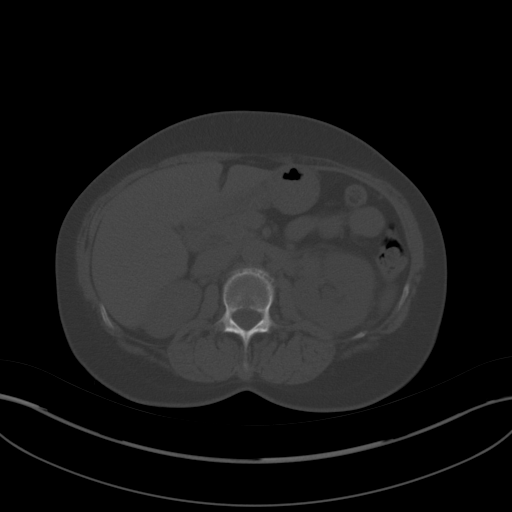
[im 67/85  soft-tissue]
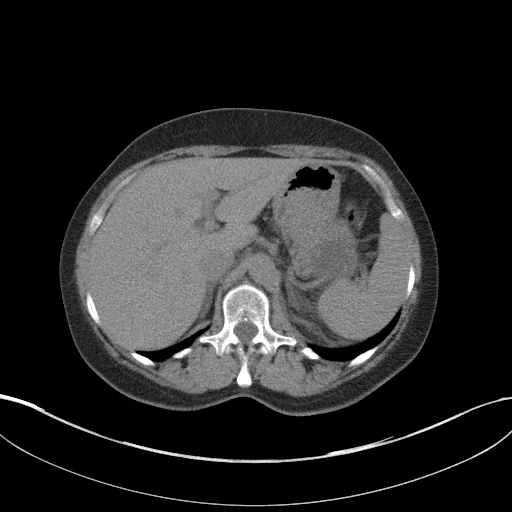
[im 74/85  soft-tissue]
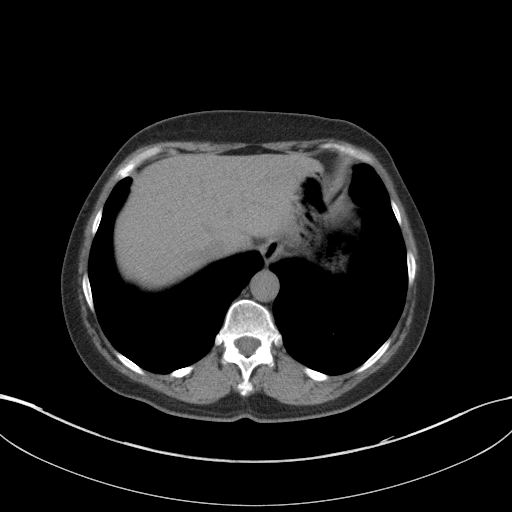
[im 81/85  soft-tissue]
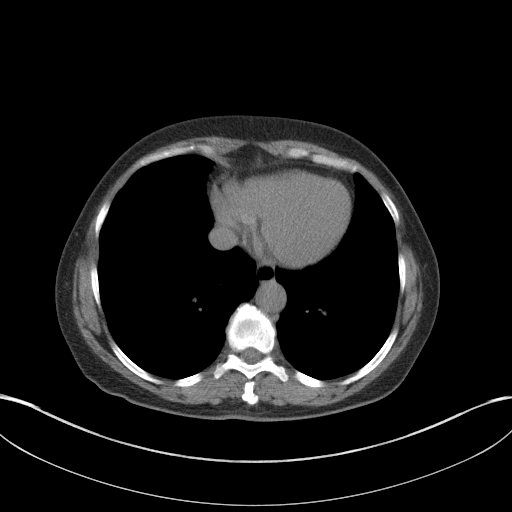

[Series 5: renal stone 3.0 coronal · coronal · 0.56mm/px · 3 of 75 slices shown]
[im 25/75  soft-tissue]
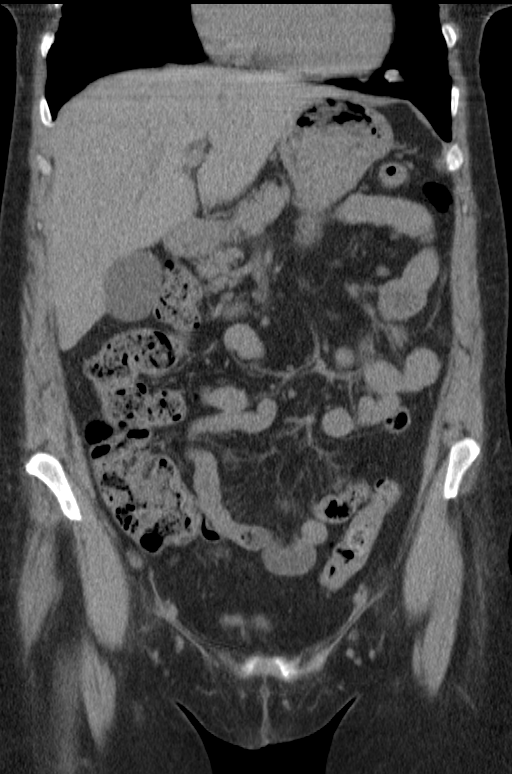
[im 33/75  soft-tissue]
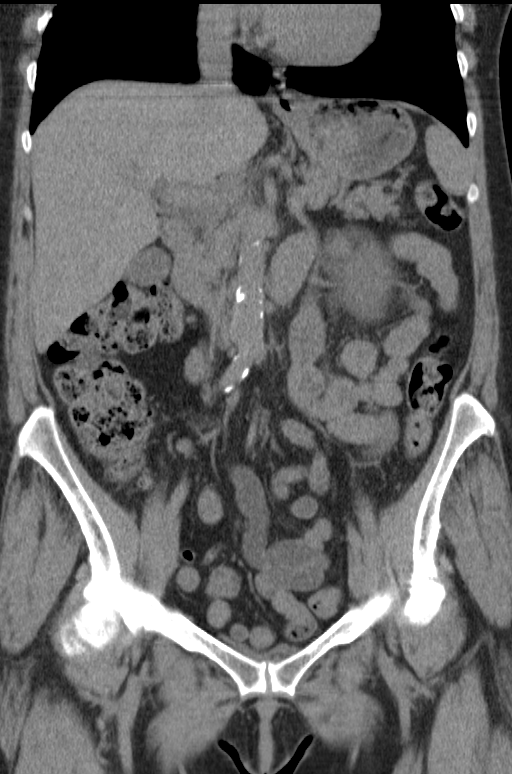
[im 42/75  soft-tissue]
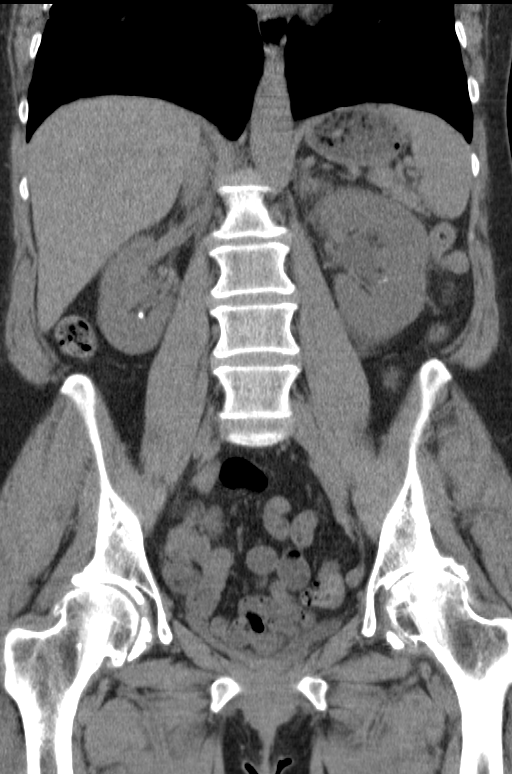

[15 of 46 positions shown; findings below may reference images not displayed]

NM PET IMAGE INITIAL
(PI) SKULL BASE TO THIGH dated 04/23/2012; CT ABD/PELV WO CM dated
02/23/2012
FINDINGS: Lingular nodule was not previously hypermetabolic and has faint
calcification, probably a pulmonary hamartoma or similar benign
lesion. This is also stable in size from 02/23/2012.

The noncontrast CT appearance of the liver, spleen, pancreas, and
adrenal glands is within normal limits. Extensive left perirenal
stranding noted with left hydronephrosis due to a 4 mm proximal
ureteral calculus near the UPJ. Distal to this the left ureter has
normal caliber, and no distal calculus is observed.

The urinary bladder is empty. There is a 3 mm right kidney lower
pole nonobstructive calculus and a 2 mm left kidney lower pole
nonobstructive calculus. There also several punctate 1-2 mm left
kidney lower pole nonobstructive calculi.

No pathologic upper abdominal adenopathy is observed. No pathologic
pelvic adenopathy is observed. Appendix normal. Uterine and adnexal
contours unremarkable. Scattered sigmoid colon diverticula. No free
pelvic fluid.

Considerable degenerative arthropathy of both hips with extensive
spurring and severe loss of articular space.
IMPRESSION: 1. Obstructive 4 mm left proximal ureteral calculus with associated
left hydronephrosis and extensive left perirenal stranding.
2. There also bilateral nonobstructive renal calculi.
3. Severe osteoarthritis of both hips.
4. Colonic diverticulosis without active diverticulitis.
5. Lingular nodule, stable from 7679, negative on PET-CT, an with
faint calcifications suggesting old granulomatous disease or
pulmonary hamartoma.

## 2016-08-20 ENCOUNTER — Emergency Department (HOSPITAL_BASED_OUTPATIENT_CLINIC_OR_DEPARTMENT_OTHER)
Admission: EM | Admit: 2016-08-20 | Discharge: 2016-08-20 | Disposition: A | Discharge status: Home / Self Care | Payer: Self-pay | Attending: Emergency Medicine | Admitting: Emergency Medicine

## 2016-08-20 ENCOUNTER — Emergency Department (HOSPITAL_BASED_OUTPATIENT_CLINIC_OR_DEPARTMENT_OTHER): Payer: Self-pay

## 2016-08-20 ENCOUNTER — Encounter (HOSPITAL_BASED_OUTPATIENT_CLINIC_OR_DEPARTMENT_OTHER): Payer: Self-pay | Admitting: Emergency Medicine

## 2016-08-20 DIAGNOSIS — F17210 Nicotine dependence, cigarettes, uncomplicated: Secondary | ICD-10-CM | POA: Insufficient documentation

## 2016-08-20 DIAGNOSIS — N23 Unspecified renal colic: Secondary | ICD-10-CM | POA: Insufficient documentation

## 2016-08-20 LAB — BASIC METABOLIC PANEL
Anion gap: 6 (ref 5–15)
BUN: 18 mg/dL (ref 6–20)
CALCIUM: 10.6 mg/dL — AB (ref 8.9–10.3)
CO2: 24 mmol/L (ref 22–32)
Chloride: 111 mmol/L (ref 101–111)
Creatinine, Ser: 0.72 mg/dL (ref 0.44–1.00)
GFR calc Af Amer: >60 mL/min (ref >60)
GFR calc non Af Amer: >60 mL/min (ref >60)
GLUCOSE: 122 mg/dL — AB (ref 65–99)
Potassium: 3.9 mmol/L (ref 3.5–5.1)
SODIUM: 141 mmol/L (ref 135–145)

## 2016-08-20 LAB — CBC
HCT: 48.8 % — ABNORMAL HIGH (ref 36.0–46.0)
Hemoglobin: 17 g/dL — ABNORMAL HIGH (ref 12.0–15.0)
MCH: 30.4 pg (ref 26.0–34.0)
MCHC: 34.8 g/dL (ref 30.0–36.0)
MCV: 87.3 fL (ref 78.0–100.0)
Platelets: 248 10*3/uL (ref 150–400)
RBC: 5.59 MIL/uL — ABNORMAL HIGH (ref 3.87–5.11)
RDW: 12.9 % (ref 11.5–15.5)
WBC: 7.5 10*3/uL (ref 4.0–10.5)

## 2016-08-20 LAB — URINALYSIS, ROUTINE W REFLEX MICROSCOPIC
GLUCOSE, UA: NEGATIVE mg/dL
KETONES UR: 15 mg/dL — AB
NITRITE: NEGATIVE
Protein, ur: 30 mg/dL — AB
Specific Gravity, Urine: 1.023 (ref 1.005–1.030)
pH: 5.5 (ref 5.0–8.0)

## 2016-08-20 LAB — URINE MICROSCOPIC-ADD ON

## 2016-08-20 LAB — PREGNANCY, URINE: Preg Test, Ur: NEGATIVE

## 2016-08-20 MED ORDER — HYDROCODONE-ACETAMINOPHEN 5-325 MG PO TABS
1.0000 | ORAL_TABLET | Freq: Four times a day (QID) | ORAL | 0 refills | Status: AC | PRN
Start: 2016-08-20 — End: ?

## 2016-08-20 MED ORDER — TAMSULOSIN HCL 0.4 MG PO CAPS: 0.4000 mg | ORAL_CAPSULE | Freq: Every day | ORAL | 0 refills | Status: AC

## 2016-08-20 MED ORDER — ONDANSETRON HCL 4 MG/2ML IJ SOLN
4.0000 mg | Freq: Once | INTRAMUSCULAR | Status: AC | PRN
  Administered 2016-08-20: 4 mg via INTRAVENOUS
  Filled 2016-08-20: qty 2

## 2016-08-20 MED ORDER — SODIUM CHLORIDE 0.9 % IV BOLUS (SEPSIS)
1000.0000 mL | Freq: Once | INTRAVENOUS | Status: AC
  Administered 2016-08-20: 1000 mL via INTRAVENOUS

## 2016-08-20 MED ORDER — CEPHALEXIN 500 MG PO CAPS: 500.0000 mg | ORAL_CAPSULE | Freq: Four times a day (QID) | ORAL | 0 refills | Status: DC

## 2016-08-20 MED ORDER — KETOROLAC TROMETHAMINE 30 MG/ML IJ SOLN
30.0000 mg | Freq: Once | INTRAMUSCULAR | Status: AC
  Administered 2016-08-20: 30 mg via INTRAVENOUS
  Filled 2016-08-20: qty 1

## 2016-08-20 MED ORDER — DEXTROSE 5 % IV SOLN
1.0000 g | Freq: Once | INTRAVENOUS | Status: AC
  Administered 2016-08-20: 1 g via INTRAVENOUS
  Filled 2016-08-20: qty 10

## 2016-08-20 NOTE — Discharge Instructions (Signed)
You have presented with symptoms of a kidney stone. There is a concurrent urinary infection. You have been given the first dose of an antibiotic here in the ED. Continue taking the oral antibiotics at home. It is highly recommended that you follow up with urology as soon as possible. Call the number provided to set up an appointment.  It is important to note that kidney stones can take up to 30 days to pass. The importance of having outpatient follow-up in the form of urology or primary care provider is for continued pain control and chronic management of this issue. Ibuprofen or naproxen for pain. Vicodin for severe pain. Do not take Vicodin while driving or performing other dangerous activities.

## 2016-08-20 NOTE — ED Triage Notes (Signed)
L flank pain x 5 days with nausea. Hx of kidney stones, denies urinary symptoms.

## 2016-08-20 NOTE — ED Notes (Signed)
Patient voided minimal per the patient. The patient's bladder scanned and less than 45 cc's in bladder. PA aware

## 2016-08-20 NOTE — ED Provider Notes (Signed)
[facility] Provider Note   CSN: [id number] Arrival date & time: 08/20/16  1555  By signing my name below, I, Soijett Blue, attest that this documentation has been prepared under the direction and in the presence of Jorma Tassinari, PA-C Electronically Signed: Soijett Blue, ED Scribe. 08/20/16. 4:50 PM.  History   Chief Complaint Chief Complaint  Patient presents with  . Flank Pain    HPI  Miranda Gould is a 60 y.o. female with a PMHx of kidney stones, sciatica, who presents to the Emergency Department complaining of intermittent left flank pain onset 2 weeks, worsening this morning. Pt notes that her current symptoms feel similar to the last time she was dx with kidney stones. Pt reports that her last kidney stone episode was 6 months ago and that she has had 4 different episodes of kidney stones. Pt denies having an urologist that she has seen for her previous kidney stones. She states that she is having associated symptoms of nausea, hematuria, bladder fullness, and vomiting. She states that she has not tried any medications for the relief of her symptoms. She denies fever, chills, abdominal pain, difficulty urinating, and any other symptoms.   The history is provided by the patient. No language interpreter was used.    Past Medical History:  Diagnosis Date  . Allergic rhinitis   . Kidney stone   . Kidney stones   . Sciatica     Patient Active Problem List   Diagnosis Date Noted  . Pulmonary nodule 04/20/2012  . Allergic rhinitis   . Kidney stones   . Sciatica     Past Surgical History:  Procedure Laterality Date  . EYE SURGERY    . NO PAST SURGERIES      OB History    No data available       Home Medications    Prior to Admission medications   Medication Sig Start Date End Date Taking? Authorizing Provider  cephALEXin (KEFLEX) 500 MG capsule Take 1 capsule (500 mg total) by mouth 4 (four) times daily. 08/20/16   Rachit Grim C Olly Shiner, PA-C  Cranberry  (AZO-CRANBERRY) 450 MG TABS Take 2 tablets by mouth once.    Historical Provider, MD  HYDROcodone-acetaminophen (NORCO/VICODIN) 5-325 MG tablet Take 1-2 tablets by mouth every 6 (six) hours as needed. 08/20/16   Iden Stripling C Georgenia Salim, PA-C  ibuprofen (ADVIL,MOTRIN) 800 MG tablet Take 1 tablet (800 mg total) by mouth every 8 (eight) hours as needed. 12/01/13   Susy Frizzle, MD  polyethylene glycol powder (GLYCOLAX/MIRALAX) powder Take 17 g by mouth daily. 12/01/12   Ivonne Andrew, PA-C  tamsulosin (FLOMAX) 0.4 MG CAPS capsule Take 1 capsule (0.4 mg total) by mouth daily. 08/20/16   Anselm Pancoast, PA-C    Family History Family History  Problem Relation Age of Onset  . Arthritis Brother     5 brothers  . Allergies      niece  . Heart attack Mother   . Breast cancer Mother     Social History Social History  Substance Use Topics  . Smoking status: Current Every Day Smoker    Packs/day: 0.50    Years: 15.00    Types: Cigarettes  . Smokeless tobacco: Never Used  . Alcohol use No     Allergies   Sulfa antibiotics   Review of Systems Review of Systems  Constitutional: Negative for chills and fever.  Gastrointestinal: Positive for nausea and vomiting. Negative for diarrhea.  Genitourinary: Positive for flank pain (left)  and hematuria. Negative for difficulty urinating and dysuria.  All other systems reviewed and are negative.    Physical Exam Updated Vital Signs BP 128/76 (BP Location: Left Arm)   Pulse 84   Temp 98.4 F (36.9 C) (Oral)   Resp 20   SpO2 97%   Physical Exam  Constitutional: She appears well-developed and well-nourished. No distress.  HENT:  Head: Normocephalic and atraumatic.  Eyes: Conjunctivae are normal.  Neck: Neck supple.  Cardiovascular: Normal rate, regular rhythm and normal heart sounds.   Pulmonary/Chest: Effort normal and breath sounds normal. No respiratory distress.  Abdominal: Soft. She exhibits no distension. There is no tenderness. There is CVA  tenderness. There is no guarding.  Left CVA tenderness  Musculoskeletal: She exhibits no edema.  Lymphadenopathy:    She has no cervical adenopathy.  Neurological: She is alert.  Skin: Skin is warm and dry. She is not diaphoretic.  Psychiatric: She has a normal mood and affect. Her behavior is normal.  Nursing note and vitals reviewed.   ED Treatments / Results  DIAGNOSTIC STUDIES: Oxygen Saturation is 97% on RA, nl by my interpretation.    COORDINATION OF CARE: 4:49 PM Discussed treatment plan with pt at bedside which includes UA, labs, toradol, zofran, abdominal xray, and pt agreed to plan.  6:30 PM- Pt reassessed and she informed the provider, Harolyn Rutherford, PA-C that that she has taken Flomax in the past without difficulty despite its relation to sulfa antibiotics.    Labs (all labs ordered are listed, but only abnormal results are displayed) Labs Reviewed  URINALYSIS, ROUTINE W REFLEX MICROSCOPIC (NOT AT [facility]) - Abnormal; Notable for the following:       Result Value   Color, Urine BROWN (*)    APPearance CLOUDY (*)    Hgb urine dipstick LARGE (*)    Bilirubin Urine SMALL (*)    Ketones, ur 15 (*)    Protein, ur 30 (*)    Leukocytes, UA SMALL (*)    All other components within normal limits  BASIC METABOLIC PANEL - Abnormal; Notable for the following:    Glucose, Bld 122 (*)    Calcium 10.6 (*)    All other components within normal limits  CBC - Abnormal; Notable for the following:    RBC 5.59 (*)    Hemoglobin 17.0 (*)    HCT 48.8 (*)    All other components within normal limits  URINE MICROSCOPIC-ADD ON - Abnormal; Notable for the following:    Squamous Epithelial / LPF 0-5 (*)    Bacteria, UA MANY (*)    All other components within normal limits  URINE CULTURE  PREGNANCY, URINE    Radiology Dg Abdomen 1 View  Result Date: 08/20/2016 CLINICAL DATA:  Suspected kidney stone.  Left-sided flank pain. EXAM: ABDOMEN - 1 VIEW COMPARISON:  Abdominal CT 04/28/2015  FINDINGS: No convincing urolithiasis. A left renal calculus seen on comparison CT is not visualized. Iliac atherosclerotic calcification seen in the left pelvis. Formed stool throughout most colonic segments. No concerning mass effect or gas collection. Bilateral hip osteoarthritis, advanced. IMPRESSION: 1. No visualized urolithiasis. 2. Formed stool throughout most of the colon without obstruction or rectal impaction. Electronically Signed   By: Marnee Spring M.D.   On: 08/20/2016 17:23    Procedures Procedures (including critical care time)  Medications Ordered in ED Medications  ondansetron (ZOFRAN) injection 4 mg (4 mg Intravenous Given 08/20/16 1632)  sodium chloride 0.9 % bolus 1,000 mL (0 mLs  Intravenous Stopped 08/20/16 1745)  ketorolac (TORADOL) 30 MG/ML injection 30 mg (30 mg Intravenous Given 08/20/16 1710)  cefTRIAXone (ROCEPHIN) 1 g in dextrose 5 % 50 mL IVPB (0 g Intravenous Stopped 08/20/16 1853)     Initial Impression / Assessment and Plan / ED Course  I have reviewed the triage vital signs and the nursing notes.  Pertinent labs & imaging results that were available during my care of the patient were reviewed by me and considered in my medical decision making (see chart for details).  Clinical Course    Patient presents with left flank pain for the last 2 weeks. Pain consistent with renal colic. Patient additionally has signs of UTI on UA. Patient is nontoxic appearing, afebrile, not tachycardic, not tachypneic, not hypotensive, maintains SPO2 of 100% on room air, and is in no apparent distress. Patient has no signs of sepsis or other serious or life-threatening condition. Patient pain-free following Toradol. Patient follow up with urology as soon as possible. Return precautions discussed.  Vitals:   08/20/16 1614 08/20/16 1712 08/20/16 1745 08/20/16 1853  BP: 128/76 128/93 140/70 156/65  Pulse: 84 75 73 89  Resp: 20 18 18 18   Temp: 98.4 F (36.9 C)     TempSrc: Oral      SpO2: 97% 100% 100% 100%     Final Clinical Impressions(s) / ED Diagnoses   Final diagnoses:  Renal colic on left side    New Prescriptions Discharge Medication List as of 08/20/2016  6:31 PM    START taking these medications   Details  cephALEXin (KEFLEX) 500 MG capsule Take 1 capsule (500 mg total) by mouth 4 (four) times daily., Starting Tue 08/20/2016, Print    HYDROcodone-acetaminophen (NORCO/VICODIN) 5-325 MG tablet Take 1-2 tablets by mouth every 6 (six) hours as needed., Starting Tue 08/20/2016, Print       I personally performed the services described in this documentation, which was scribed in my presence. The recorded information has been reviewed and is accurate.     Anselm PancoastShawn C Eutha Cude, PA-C 08/21/16 0032    Geoffery Lyonsouglas Delo, MD 08/22/16 [phone]

## 2016-08-21 LAB — URINE CULTURE: Culture: <10000 — AB

## 2018-07-13 IMAGING — CR DG ABDOMEN 1V
1 series · 1 of 1 positions shown · non-contrast
Comparison: Abdominal CT 04/28/2015

CLINICAL DATA: Suspected kidney stone.  Left-sided flank pain.

EXAM:
ABDOMEN - 1 VIEW

[t abdomen supine]
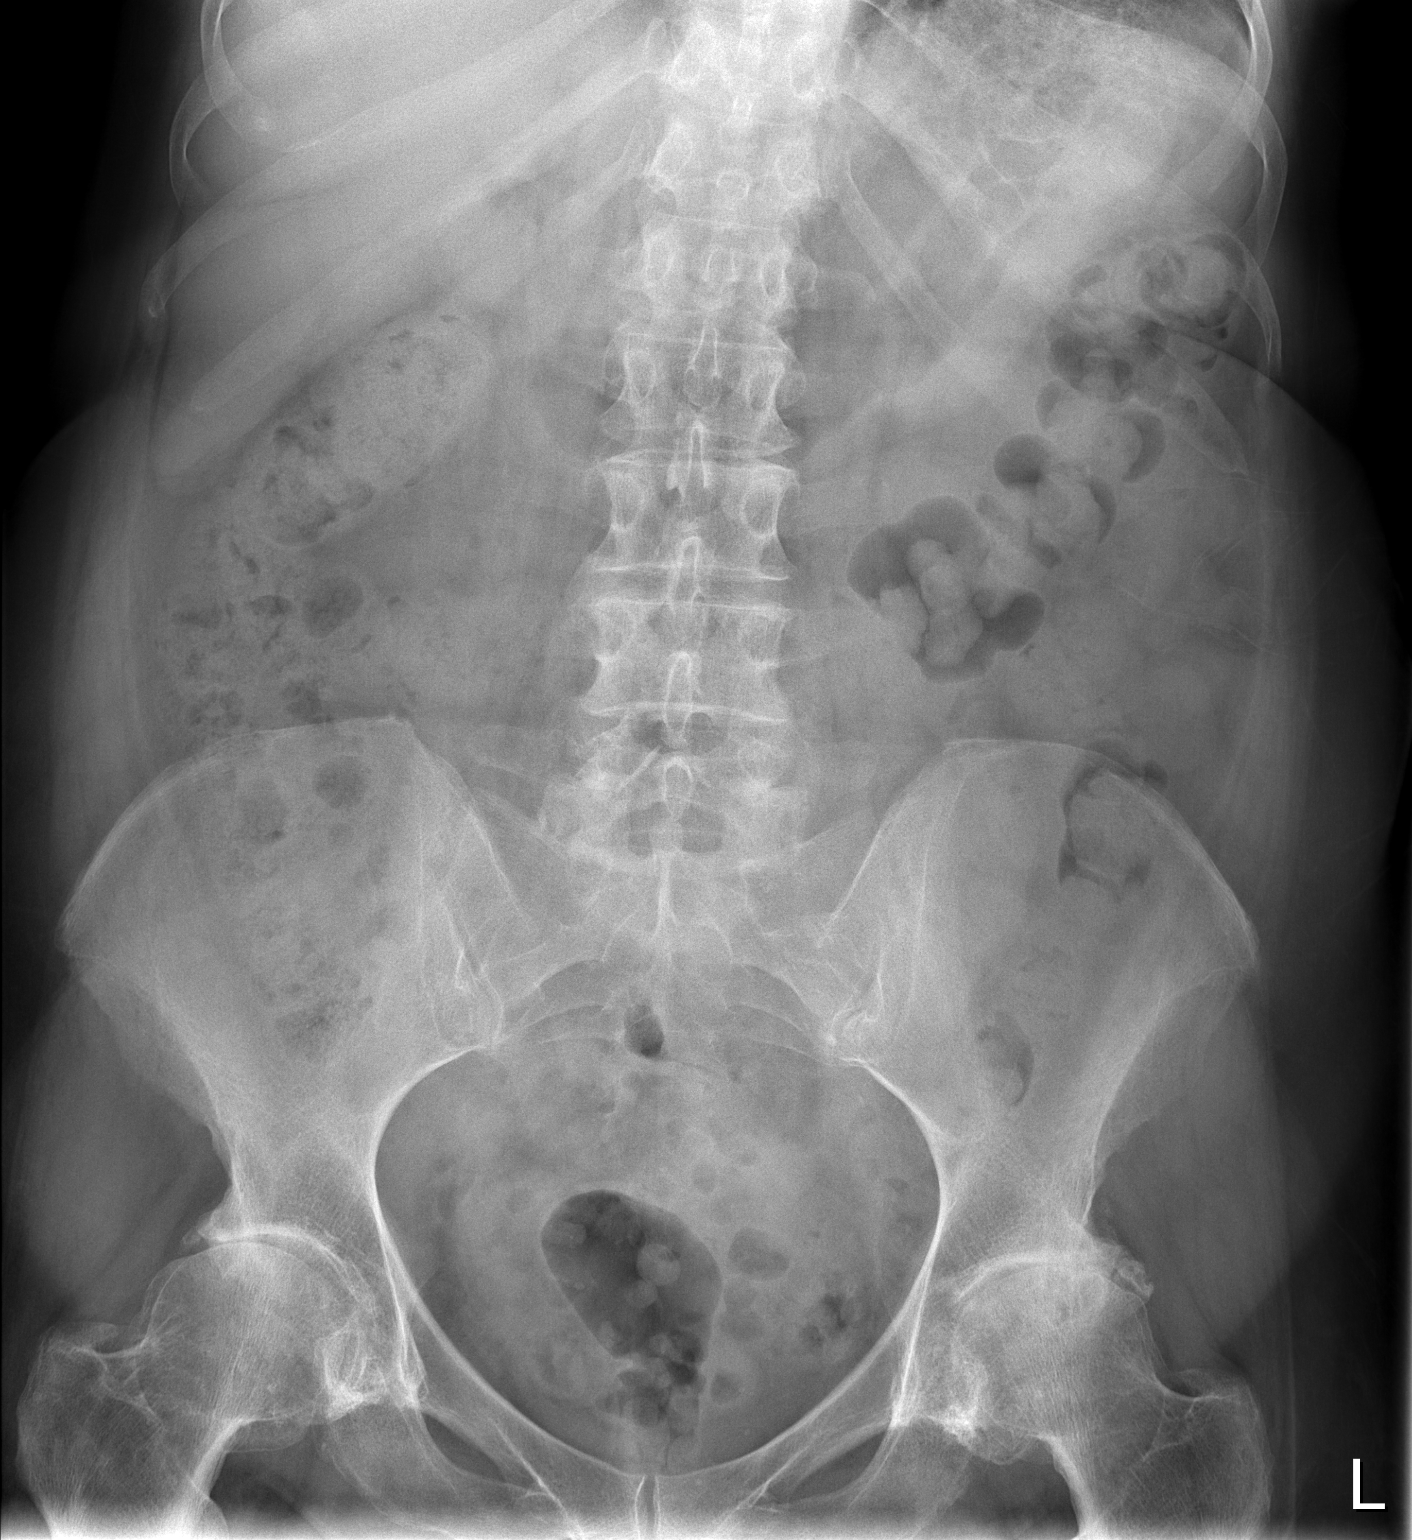

[1 of 1 positions shown; findings below may reference images not displayed]

FINDINGS: No convincing urolithiasis. A left renal calculus seen on comparison
CT is not visualized. Iliac atherosclerotic calcification seen in
the left pelvis.

Formed stool throughout most colonic segments. No concerning mass
effect or gas collection.

Bilateral hip osteoarthritis, advanced.
IMPRESSION: 1. No visualized urolithiasis.
2. Formed stool throughout most of the colon without obstruction or
rectal impaction.

## 2018-11-10 ENCOUNTER — Other Ambulatory Visit: Payer: Self-pay

## 2018-11-10 ENCOUNTER — Emergency Department (HOSPITAL_BASED_OUTPATIENT_CLINIC_OR_DEPARTMENT_OTHER): Payer: Self-pay

## 2018-11-10 ENCOUNTER — Emergency Department (HOSPITAL_BASED_OUTPATIENT_CLINIC_OR_DEPARTMENT_OTHER)
Admission: EM | Admit: 2018-11-10 | Discharge: 2018-11-10 | Disposition: A | Discharge status: Home / Self Care | Payer: Self-pay | Attending: Emergency Medicine | Admitting: Emergency Medicine

## 2018-11-10 ENCOUNTER — Encounter (HOSPITAL_BASED_OUTPATIENT_CLINIC_OR_DEPARTMENT_OTHER): Payer: Self-pay | Admitting: *Deleted

## 2018-11-10 DIAGNOSIS — R109 Unspecified abdominal pain: Secondary | ICD-10-CM | POA: Insufficient documentation

## 2018-11-10 DIAGNOSIS — N12 Tubulo-interstitial nephritis, not specified as acute or chronic: Secondary | ICD-10-CM | POA: Insufficient documentation

## 2018-11-10 DIAGNOSIS — Z79899 Other long term (current) drug therapy: Secondary | ICD-10-CM | POA: Insufficient documentation

## 2018-11-10 DIAGNOSIS — F17210 Nicotine dependence, cigarettes, uncomplicated: Secondary | ICD-10-CM | POA: Insufficient documentation

## 2018-11-10 LAB — URINALYSIS, ROUTINE W REFLEX MICROSCOPIC
Glucose, UA: NEGATIVE mg/dL
KETONES UR: 15 mg/dL — AB
NITRITE: POSITIVE — AB
Protein, ur: >300 mg/dL — AB
Specific Gravity, Urine: 1.02 (ref 1.005–1.030)
pH: 5 (ref 5.0–8.0)

## 2018-11-10 LAB — BASIC METABOLIC PANEL
Anion gap: 10 (ref 5–15)
BUN: 28 mg/dL — AB (ref 8–23)
CHLORIDE: 100 mmol/L (ref 98–111)
CO2: 22 mmol/L (ref 22–32)
Calcium: 9.9 mg/dL (ref 8.9–10.3)
Creatinine, Ser: 1.34 mg/dL — ABNORMAL HIGH (ref 0.44–1.00)
GFR calc Af Amer: 49 mL/min — ABNORMAL LOW (ref >60)
GFR calc non Af Amer: 42 mL/min — ABNORMAL LOW (ref >60)
Glucose, Bld: 116 mg/dL — ABNORMAL HIGH (ref 70–99)
Potassium: 3.1 mmol/L — ABNORMAL LOW (ref 3.5–5.1)
Sodium: 132 mmol/L — ABNORMAL LOW (ref 135–145)

## 2018-11-10 LAB — CBC WITH DIFFERENTIAL/PLATELET
ABS IMMATURE GRANULOCYTES: 0.05 10*3/uL (ref 0.00–0.07)
Basophils Absolute: 0 10*3/uL (ref 0.0–0.1)
Basophils Relative: 0 %
EOS PCT: 0 %
Eosinophils Absolute: 0 10*3/uL (ref 0.0–0.5)
HEMATOCRIT: 50.5 % — AB (ref 36.0–46.0)
Hemoglobin: 16.6 g/dL — ABNORMAL HIGH (ref 12.0–15.0)
Immature Granulocytes: 1 %
LYMPHS PCT: 11 %
Lymphs Abs: 1.2 10*3/uL (ref 0.7–4.0)
MCH: 28.8 pg (ref 26.0–34.0)
MCHC: 32.9 g/dL (ref 30.0–36.0)
MCV: 87.7 fL (ref 80.0–100.0)
MONO ABS: 1.1 10*3/uL — AB (ref 0.1–1.0)
Monocytes Relative: 10 %
NRBC: 0 % (ref 0.0–0.2)
Neutro Abs: 8.4 10*3/uL — ABNORMAL HIGH (ref 1.7–7.7)
Neutrophils Relative %: 78 %
Platelets: 160 10*3/uL (ref 150–400)
RBC: 5.76 MIL/uL — ABNORMAL HIGH (ref 3.87–5.11)
RDW: 12.1 % (ref 11.5–15.5)
WBC: 10.9 10*3/uL — ABNORMAL HIGH (ref 4.0–10.5)

## 2018-11-10 LAB — URINALYSIS, MICROSCOPIC (REFLEX): WBC, UA: >50 WBC/hpf (ref 0–5)

## 2018-11-10 MED ORDER — SODIUM CHLORIDE 0.9 % IV SOLN
1.0000 g | Freq: Once | INTRAVENOUS | Status: AC
  Administered 2018-11-10: 1 g via INTRAVENOUS
  Filled 2018-11-10: qty 10

## 2018-11-10 MED ORDER — MORPHINE SULFATE (PF) 4 MG/ML IV SOLN
4.0000 mg | Freq: Once | INTRAVENOUS | Status: AC
  Administered 2018-11-10: 4 mg via INTRAVENOUS
  Filled 2018-11-10: qty 1

## 2018-11-10 MED ORDER — SODIUM CHLORIDE 0.9 % IV BOLUS
1000.0000 mL | Freq: Once | INTRAVENOUS | Status: AC
  Administered 2018-11-10: 1000 mL via INTRAVENOUS

## 2018-11-10 MED ORDER — ONDANSETRON HCL 4 MG/2ML IJ SOLN
4.0000 mg | Freq: Once | INTRAMUSCULAR | Status: AC
  Administered 2018-11-10: 4 mg via INTRAVENOUS
  Filled 2018-11-10: qty 2

## 2018-11-10 MED ORDER — POTASSIUM CHLORIDE CRYS ER 20 MEQ PO TBCR
40.0000 meq | EXTENDED_RELEASE_TABLET | Freq: Once | ORAL | Status: AC
  Administered 2018-11-10: 40 meq via ORAL
  Filled 2018-11-10: qty 2

## 2018-11-10 MED ORDER — CEPHALEXIN 500 MG PO CAPS: 500.0000 mg | ORAL_CAPSULE | Freq: Four times a day (QID) | ORAL | 0 refills | Status: AC

## 2018-11-10 NOTE — ED Triage Notes (Signed)
Last week she had dysuria. She took OTC AZO. Now she is having scanty urine. No BM for a week.

## 2018-11-10 NOTE — Discharge Instructions (Addendum)
Take Keflex as prescribed and complete the full course. Follow-up with your primary care doctor for recheck.  Return to ER for new or worsening symptoms.

## 2018-11-10 NOTE — ED Provider Notes (Signed)
[facility] EMERGENCY DEPARTMENT Provider Note   CSN: [id number] Arrival date & time: 11/10/18  1811     History   Chief Complaint Chief Complaint  Patient presents with  . Dysuria    HPI Miranda Gould is a 63 y.o. female.  63 year old female presents with complaint of dysuria, frequency, urgency and feeling generally unwell.  Patient states a week ago she developed right flank pain with dysuria and took AZO which she thought was helping with her symptoms.  Patient states that she continues have symptoms with chills and sweats, assumes that she has been running a fever but has not checked her temperature, reports nausea and vomiting.  Patient states that she has not had a bowel movement for 1 week.  Reports history of kidney stones, has not needed lithotripsy or stents placed previously.  No other complaints or concerns today.     Past Medical History:  Diagnosis Date  . Allergic rhinitis   . Kidney stone   . Kidney stones   . Sciatica     Patient Active Problem List   Diagnosis Date Noted  . Pulmonary nodule 04/20/2012  . Allergic rhinitis   . Kidney stones   . Sciatica     Past Surgical History:  Procedure Laterality Date  . EYE SURGERY    . NO PAST SURGERIES       OB History   No obstetric history on file.      Home Medications    Prior to Admission medications   Medication Sig Start Date End Date Taking? Authorizing Provider  cephALEXin (KEFLEX) 500 MG capsule Take 1 capsule (500 mg total) by mouth 4 (four) times daily for 10 days. 11/10/18 11/20/18  Jeannie FendMurphy, Adlean Hardeman A, PA-C  Cranberry (AZO-CRANBERRY) 450 MG TABS Take 2 tablets by mouth once.    [provider]  HYDROcodone-acetaminophen (NORCO/VICODIN) 5-325 MG tablet Take 1-2 tablets by mouth every 6 (six) hours as needed. 08/20/16   Joy, Shawn C, PA-C  ibuprofen (ADVIL,MOTRIN) 800 MG tablet Take 1 tablet (800 mg total) by mouth every 8 (eight) hours as needed. 12/01/13   Susy FrizzleSheldon, Charles, MD    polyethylene glycol powder (GLYCOLAX/MIRALAX) powder Take 17 g by mouth daily. 12/01/12   Ivonne Andrewammen, Peter, PA-C  tamsulosin (FLOMAX) 0.4 MG CAPS capsule Take 1 capsule (0.4 mg total) by mouth daily. 08/20/16   Anselm PancoastJoy, Shawn C, PA-C    Family History Family History  Problem Relation Age of Onset  . Arthritis Brother        5 brothers  . Allergies Other        niece  . Heart attack Mother   . Breast cancer Mother     Social History Social History   Tobacco Use  . Smoking status: Current Every Day Smoker    Packs/day: 0.50    Years: 15.00    Pack years: 7.50    Types: Cigarettes  . Smokeless tobacco: Never Used  Substance Use Topics  . Alcohol use: No  . Drug use: Yes    Types: Marijuana    Comment: once a week     Allergies   Sulfa antibiotics   Review of Systems Review of Systems  Constitutional: Positive for chills and diaphoresis.  Gastrointestinal: Positive for abdominal pain, nausea and vomiting. Negative for constipation and diarrhea.  Genitourinary: Positive for difficulty urinating, dysuria, frequency and urgency.  Musculoskeletal: Negative for arthralgias and myalgias.  Skin: Negative for rash and wound.  Allergic/Immunologic: Negative for immunocompromised state.  Neurological: Negative for dizziness and weakness.  Hematological: Negative for adenopathy. Does not bruise/bleed easily.  Psychiatric/Behavioral: Negative for confusion.  All other systems reviewed and are negative.    Physical Exam Updated Vital Signs BP 132/78 (BP Location: Left Arm)   Pulse 68   Temp 98.2 F (36.8 C) (Oral)   Resp 16   Ht 5\' 1"  (1.549 m)   Wt 73.9 kg   SpO2 100%   BMI 30.80 kg/m   Physical Exam Vitals signs and nursing note reviewed.  Constitutional:      General: She is not in acute distress.    Appearance: She is well-developed. She is not diaphoretic.     Comments: Appears to feel unwell.   HENT:     Head: Normocephalic and atraumatic.     Mouth/Throat:      Mouth: Mucous membranes are moist.  Cardiovascular:     Rate and Rhythm: Normal rate and regular rhythm.     Pulses: Normal pulses.     Heart sounds: Normal heart sounds. No murmur.  Pulmonary:     Effort: Pulmonary effort is normal.     Breath sounds: Normal breath sounds.  Abdominal:     General: There is no distension.     Tenderness: There is generalized abdominal tenderness. There is no right CVA tenderness or left CVA tenderness.  Skin:    General: Skin is warm and dry.  Neurological:     Mental Status: She is alert and oriented to person, place, and time.  Psychiatric:        Behavior: Behavior normal.      ED Treatments / Results  Labs (all labs ordered are listed, but only abnormal results are displayed) Labs Reviewed  URINALYSIS, ROUTINE W REFLEX MICROSCOPIC - Abnormal; Notable for the following components:      Result Value   Color, Urine AMBER (*)    APPearance CLOUDY (*)    Hgb urine dipstick LARGE (*)    Bilirubin Urine MODERATE (*)    Ketones, ur 15 (*)    Protein, ur >300 (*)    Nitrite POSITIVE (*)    Leukocytes, UA MODERATE (*)    All other components within normal limits  URINALYSIS, MICROSCOPIC (REFLEX) - Abnormal; Notable for the following components:   Bacteria, UA MANY (*)    All other components within normal limits  BASIC METABOLIC PANEL - Abnormal; Notable for the following components:   Sodium 132 (*)    Potassium 3.1 (*)    Glucose, Bld 116 (*)    BUN 28 (*)    Creatinine, Ser 1.34 (*)    GFR calc non Af Amer 42 (*)    GFR calc Af Amer 49 (*)    All other components within normal limits  CBC WITH DIFFERENTIAL/PLATELET - Abnormal; Notable for the following components:   WBC 10.9 (*)    RBC 5.76 (*)    Hemoglobin 16.6 (*)    HCT 50.5 (*)    Neutro Abs 8.4 (*)    Monocytes Absolute 1.1 (*)    All other components within normal limits  URINE CULTURE    EKG None  Radiology Ct Renal Stone Study  Result Date: 11/10/2018 CLINICAL  DATA:  Initial evaluation for acute right flank pain. EXAM: CT ABDOMEN AND PELVIS WITHOUT CONTRAST TECHNIQUE: Multidetector CT imaging of the abdomen and pelvis was performed following the standard protocol without IV contrast. COMPARISON:  Prior CT from 04/28/2015. FINDINGS: Lower chest: Visualized lung bases are  largely clear. Prominent calcified granuloma at the anterior left lung base measures 16 x 13 mm, mildly increased in size as compared to previous CT from 2016. Hepatobiliary: Hepatic steatosis with geographic areas of fatty sparing. Liver otherwise unremarkable. Gallbladder within normal limits. No biliary dilatation. Pancreas: Pancreas within normal limits. Spleen: Unremarkable. Adrenals/Urinary Tract: Adrenal glands within normal limits. Two small nonobstructive calculi measuring up to 3 mm present within the lower pole the left kidney. No other radiopaque calculi seen along the course of the left renal collecting system. No left-sided hydronephrosis or hydroureter. On the right, there is mild asymmetric fullness of the right renal collecting system with associated perinephric and periureteral fat stranding. No obstructive radiopaque stone identified. Finding raises the possibility for a recently passed stone. Possible infection could also have this appearance. No other radiopaque calculi within the right kidney. Bladder decompressed without acute abnormality. No layering stones within the bladder lumen. Stomach/Bowel: Stomach within normal limits. No evidence for bowel obstruction. Colonic diverticulosis without evidence for acute diverticulitis. No acute inflammatory changes seen about the bowels. 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 No adenopathy. Reproductive: Uterus and ovaries within normal limits. Other: No free air or fluid. Musculoskeletal: 1 cm soft tissue lesion within the subcutaneous  fat of the lower mid pelvis, likely a small sebaceous cyst. No acute osseous abnormality. No discrete lytic or blastic osseous lesions. 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 IMPRESSION: 1. Mild asymmetric fullness of the right renal collecting system with associated perinephric and periureteral fat stranding. No obstructive radiopaque stone identified. Finding raises the possibility for a recently passed stone. Possible infection could also have this appearance. Correlation with urinalysis recommended. 2. Nonobstructive left renal nephrolithiasis. 3. Colonic diverticulosis without evidence for acute diverticulitis. 4. Hepatic steatosis. Electronically Signed   By: Rise Mu M.D.   On: 11/10/2018 20:33    Procedures Procedures (including critical care time)  Medications Ordered in ED Medications  sodium chloride 0.9 % bolus 1,000 mL (0 mLs Intravenous Stopped 11/10/18 2034)  ondansetron (ZOFRAN) injection 4 mg (4 mg Intravenous Given 11/10/18 1926)  morphine 4 MG/ML injection 4 mg (4 mg Intravenous Given 11/10/18 1926)  cefTRIAXone (ROCEPHIN) 1 g in sodium chloride 0.9 % 100 mL IVPB (0 g Intravenous Stopped 11/10/18 2034)  potassium chloride SA (K-DUR,KLOR-CON) CR tablet 40 mEq (40 mEq Oral Given 11/10/18 2111)     Initial Impression / Assessment and Plan / ED Course  I have reviewed the triage vital signs and the nursing notes.  Pertinent labs & imaging results that were available during my care of the patient were reviewed by me and considered in my medical decision making (see chart for details).  Clinical Course as of Nov 11 2115  Tue Nov 10, 2018  [id number] 63 year old female with complaint of right flank pain with urinary symptoms, nausea and vomiting.  Patient symptoms started 1 week ago, she treated at home with AZO.  Patient states that her flank pain has resolved however she continues to feel unwell.  On exam patient does not have any CVA tenderness,  she has generalized abdominal tenderness, her vital signs have been stable and she is been afebrile.  Review of lab work shows a mild leukocytosis with white count of 10.9, BMP with mild hypokalemia potassium 3.1, patient orally repleted while in the emergency room.  Patient's creatinine is mildly increased at 1.34, she was given IV fluids while in the  ER.  Urinalysis suggestive of urinary tract infection, CT urogram shows findings consistent with pyelonephritis.  Patient was given IV fluids while in the ER, discharged home on Keflex, urine culture pending.  Patient was advised to follow-up with her primary care provider for recheck, return to ER for any new or worsening symptoms.  Patient verbalizes understanding of discharge instructions and plan, she is tolerating small sips of water at this time and is feeling much better.   [LM]    Clinical Course User Index [LM] Jeannie FendMurphy, Daniqua Campoy A, PA-C   Final Clinical Impressions(s) / ED Diagnoses   Final diagnoses:  Pyelonephritis    ED Discharge Orders         Ordered    cephALEXin (KEFLEX) 500 MG capsule  4 times daily     11/10/18 2103           Alden HippMurphy, Abelina Ketron A, PA-C 11/10/18 2118    Rolan BuccoBelfi, Melanie, MD 11/10/18 2119

## 2018-11-13 LAB — URINE CULTURE

## 2018-11-14 ENCOUNTER — Telehealth: Payer: Self-pay | Admitting: Emergency Medicine

## 2018-11-14 NOTE — Telephone Encounter (Signed)
Post ED Visit - Positive Culture Follow-up  Culture report reviewed by antimicrobial stewardship pharmacist:  []  Enzo Bi, Pharm.D. []  Celedonio Miyamoto, Pharm.D., BCPS AQ-ID []  Garvin Fila, Pharm.D., BCPS []  Georgina Pillion, [address].D., BCPS []  [place], [address].D., BCPS, AAHIVP []  Estella Husk, Pharm.D., BCPS, AAHIVP []  Lysle Pearl, PharmD, BCPS []  Phillips Climes, PharmD, BCPS []  Agapito Games, PharmD, BCPS [x]  Verlan Friends, PharmD  Positive urine culture Treated with Cephalexin, organism sensitive to the same and no further patient follow-up is required at this time.  Carollee Herter Jarrin Staley 11/14/2018, 1:03 PM

## 2020-10-02 IMAGING — CT CT RENAL STONE PROTOCOL
2 of 4 series · 15 of 46 positions shown, 17 images · non-contrast
Comparison: Prior CT from 04/28/2015.

CLINICAL DATA: Initial evaluation for acute right flank pain.

EXAM:
CT ABDOMEN AND PELVIS WITHOUT CONTRAST
TECHNIQUE: Multidetector CT imaging of the abdomen and pelvis was performed
following the standard protocol without IV contrast.

[Series 2: axial st · axial · 0.66mm/px · z∈[-588,-168]mm · 12 of 94 slices shown, 14 images]
[im 5/94  soft-tissue]
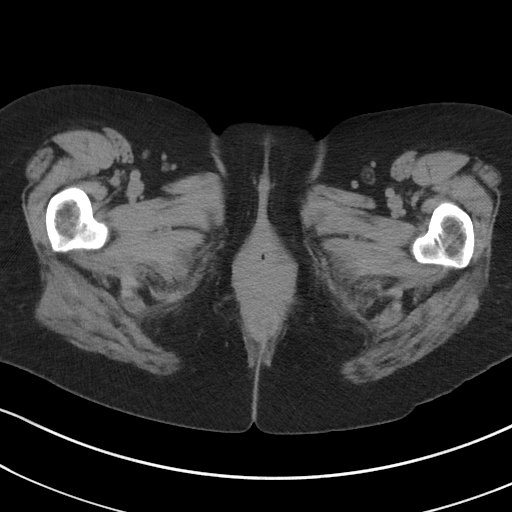
[im 5/94  bone]
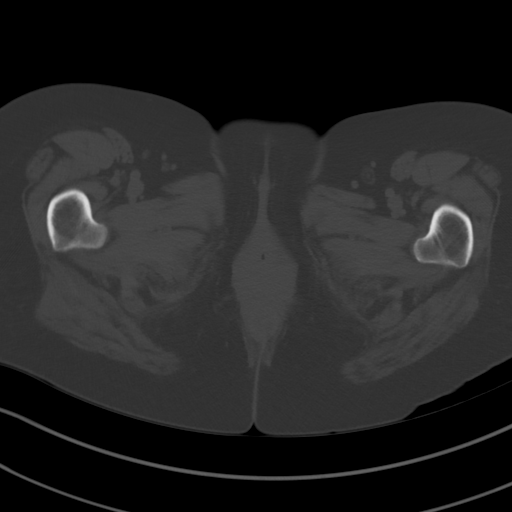
[im 13/94  soft-tissue]
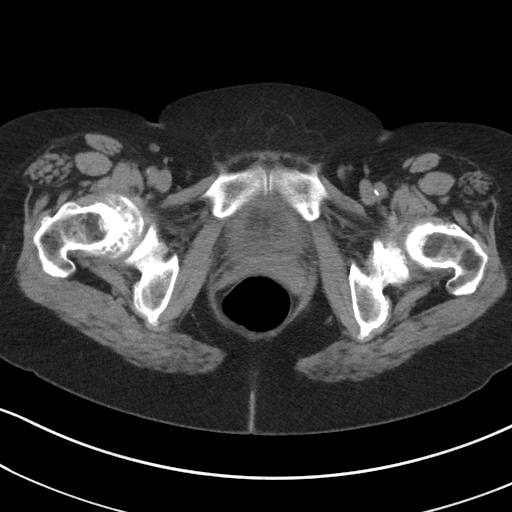
[im 21/94  soft-tissue]
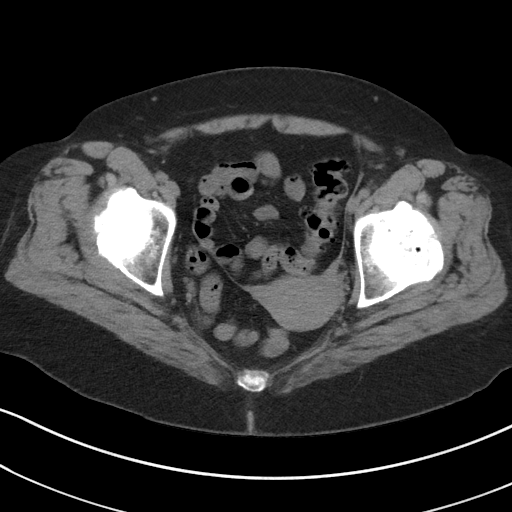
[im 29/94  soft-tissue]
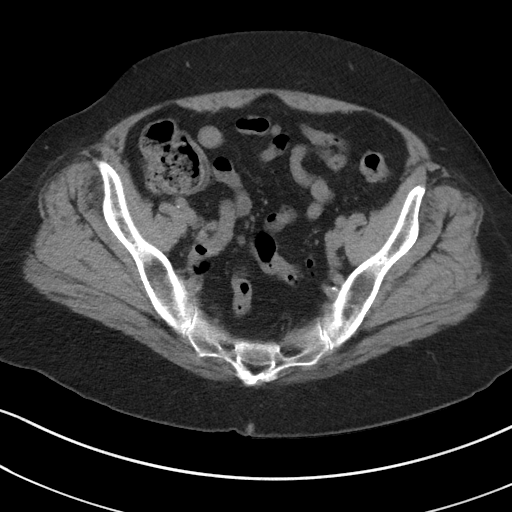
[im 37/94  soft-tissue]
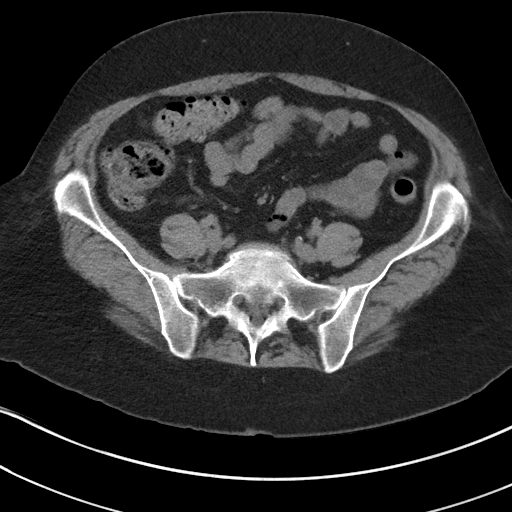
[im 45/94  soft-tissue]
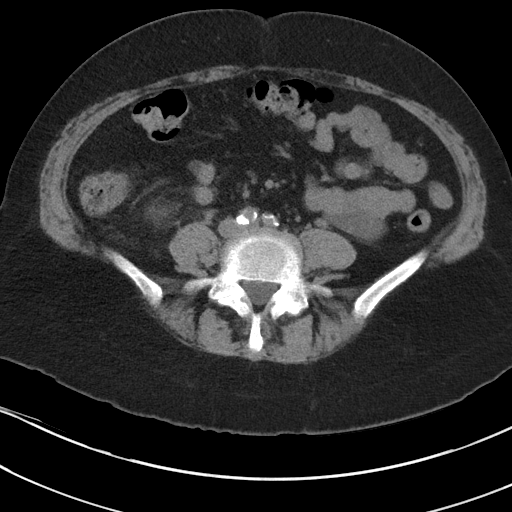
[im 49/94  soft-tissue]
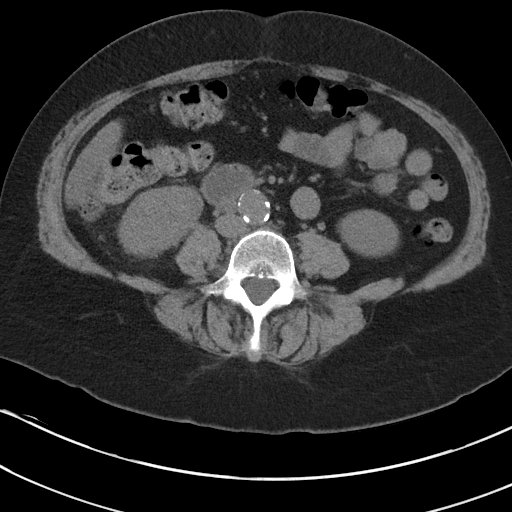
[im 57/94  soft-tissue]
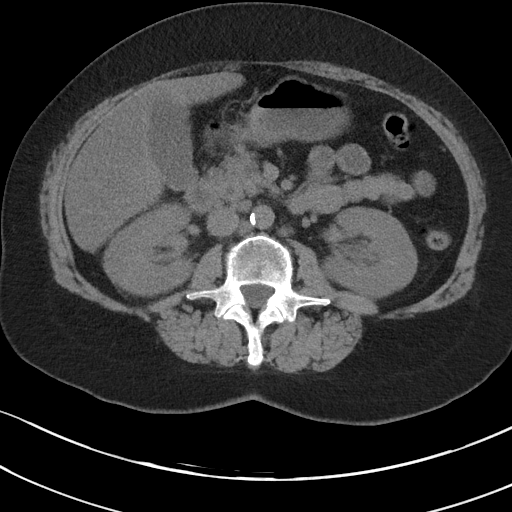
[im 65/94  soft-tissue]
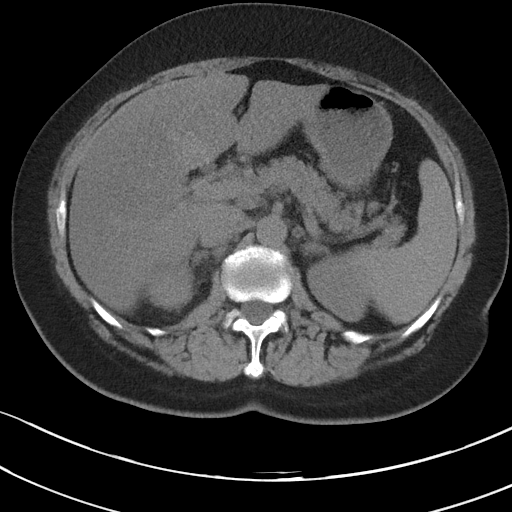
[im 65/94  bone]
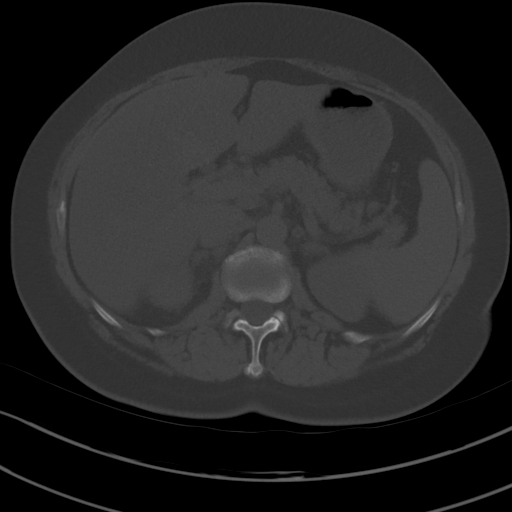
[im 73/94  soft-tissue]
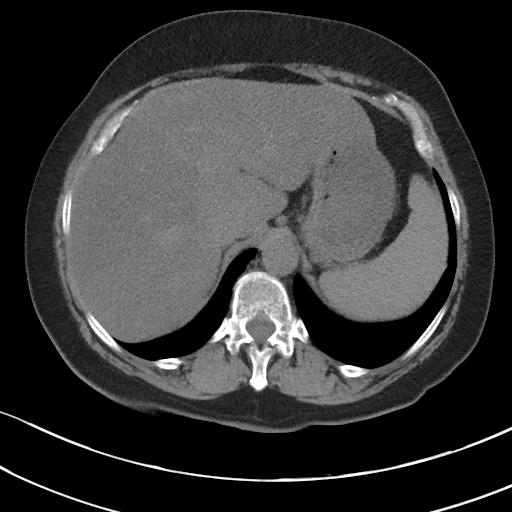
[im 81/94  soft-tissue]
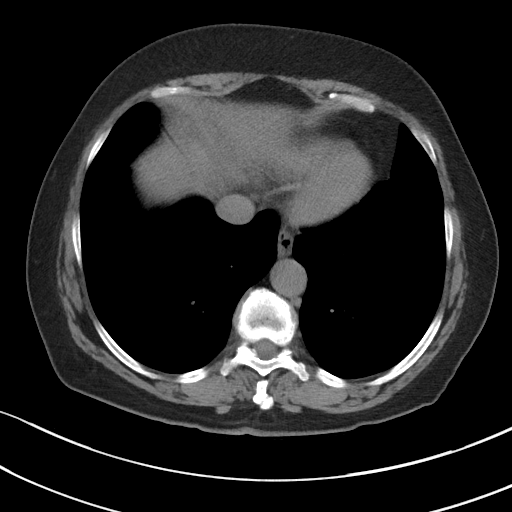
[im 89/94  soft-tissue]
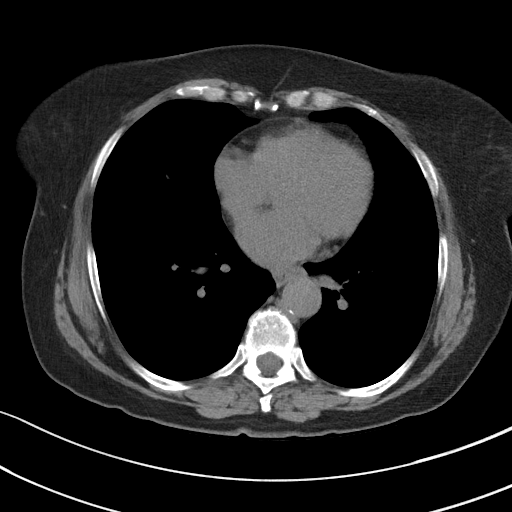

[Series 5: coronal st · coronal · 0.81mm/px · 3 of 101 slices shown]
[im 34/101  soft-tissue]
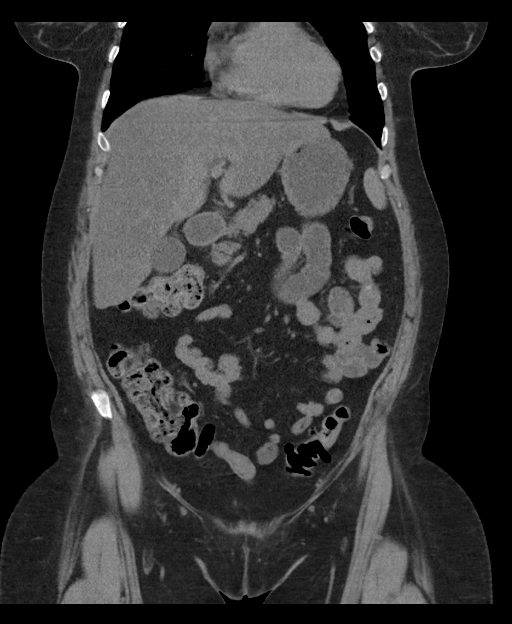
[im 45/101  soft-tissue]
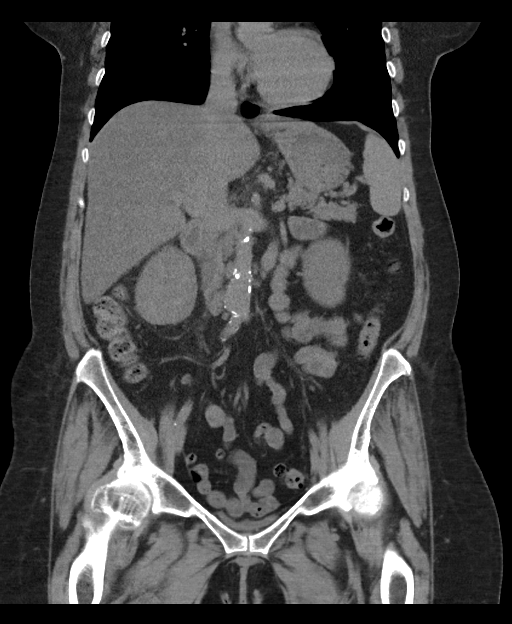
[im 56/101  soft-tissue]
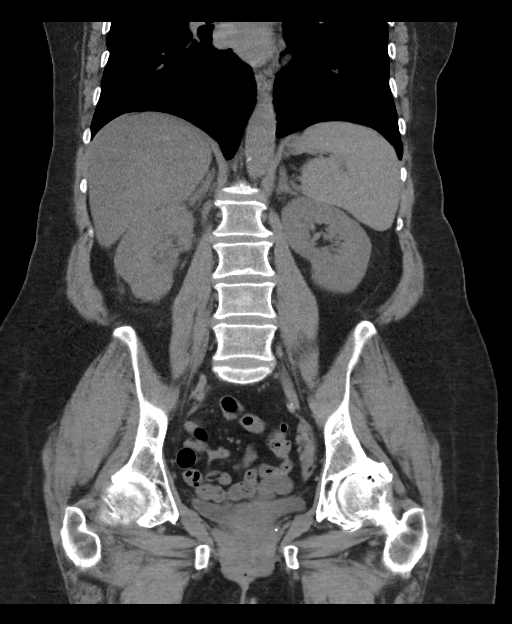

[15 of 46 positions shown; findings below may reference images not displayed]

FINDINGS: Lower chest: Visualized lung bases are largely clear. Prominent
calcified granuloma at the anterior left lung base measures 16 x 13
mm, mildly increased in size as compared to previous CT from 8801.

Hepatobiliary: Hepatic steatosis with geographic areas of fatty
sparing. Liver otherwise unremarkable. Gallbladder within normal
limits. No biliary dilatation.

Pancreas: Pancreas within normal limits.

Spleen: Unremarkable.

Adrenals/Urinary Tract: Adrenal glands within normal limits.

Two small nonobstructive calculi measuring up to 3 mm present within
the lower pole the left kidney. No other radiopaque calculi seen
along the course of the left renal collecting system. No left-sided
hydronephrosis or hydroureter.

On the right, there is mild asymmetric fullness of the right renal
collecting system with associated perinephric and periureteral fat
stranding. No obstructive radiopaque stone identified. Finding
raises the possibility for a recently passed stone. Possible
infection could also have this appearance. No other radiopaque
calculi within the right kidney.

Bladder decompressed without acute abnormality. No layering stones
within the bladder lumen.

Stomach/Bowel: Stomach within normal limits. No evidence for bowel
obstruction. Colonic diverticulosis without evidence for acute
diverticulitis. No acute inflammatory changes seen about the bowels.

Vascular/Lymphatic: Moderate aorto bi-iliac atherosclerotic disease.
Focal dilatation of the intraabdominal aorta up to 2.4 cm without
aneurysm. No adenopathy.

Reproductive: Uterus and ovaries within normal limits.

Other: No free air or fluid.

Musculoskeletal: 1 cm soft tissue lesion within the subcutaneous fat
of the lower mid pelvis, likely a small sebaceous cyst. No acute
osseous abnormality. No discrete lytic or blastic osseous lesions.
Prominent degenerative osteoarthrosis noted about the hips
bilaterally.
IMPRESSION: 1. Mild asymmetric fullness of the right renal collecting system
with associated perinephric and periureteral fat stranding. No
obstructive radiopaque stone identified. Finding raises the
possibility for a recently passed stone. Possible infection could
also have this appearance. Correlation with urinalysis recommended.
2. Nonobstructive left renal nephrolithiasis.
3. Colonic diverticulosis without evidence for acute diverticulitis.
4. Hepatic steatosis.
